# Patient Record
Sex: Female | Born: 1950
Health system: Southern US, Community
[De-identification: ages and names within clinical notes are randomized; demographics above are authoritative.]

## PROBLEM LIST (undated history)

## (undated) DIAGNOSIS — I2510 Atherosclerotic heart disease of native coronary artery without angina pectoris: Secondary | ICD-10-CM

## (undated) DIAGNOSIS — I10 Essential (primary) hypertension: Secondary | ICD-10-CM

## (undated) DIAGNOSIS — K219 Gastro-esophageal reflux disease without esophagitis: Secondary | ICD-10-CM

## (undated) DIAGNOSIS — E785 Hyperlipidemia, unspecified: Secondary | ICD-10-CM

## (undated) DIAGNOSIS — M1990 Unspecified osteoarthritis, unspecified site: Secondary | ICD-10-CM

## (undated) HISTORY — PX: TENDON REPAIR: SHX5111

## (undated) HISTORY — DX: Essential (primary) hypertension: I10

## (undated) HISTORY — DX: Hyperlipidemia, unspecified: E78.5

## (undated) HISTORY — DX: Gastro-esophageal reflux disease without esophagitis: K21.9

## (undated) HISTORY — PX: SHOULDER ARTHROSCOPY: SHX128

## (undated) HISTORY — PX: CHOLECYSTECTOMY: SHX55

## (undated) HISTORY — DX: Atherosclerotic heart disease of native coronary artery without angina pectoris: I25.10

---

## 2002-05-27 ENCOUNTER — Ambulatory Visit (HOSPITAL_COMMUNITY): Admission: EM | Admit: 2002-05-27 | Discharge: 2002-05-28 | Payer: Self-pay | Admitting: Cardiology

## 2010-05-14 ENCOUNTER — Ambulatory Visit (INDEPENDENT_AMBULATORY_CARE_PROVIDER_SITE_OTHER): Payer: Self-pay | Admitting: Internal Medicine

## 2010-05-14 DIAGNOSIS — K219 Gastro-esophageal reflux disease without esophagitis: Secondary | ICD-10-CM

## 2010-05-14 DIAGNOSIS — R1013 Epigastric pain: Secondary | ICD-10-CM

## 2011-01-08 HISTORY — PX: SHOULDER ARTHROSCOPY: SHX128

## 2013-04-06 ENCOUNTER — Encounter (INDEPENDENT_AMBULATORY_CARE_PROVIDER_SITE_OTHER): Payer: Self-pay | Admitting: *Deleted

## 2013-04-06 ENCOUNTER — Encounter (INDEPENDENT_AMBULATORY_CARE_PROVIDER_SITE_OTHER): Payer: Self-pay

## 2013-04-28 ENCOUNTER — Ambulatory Visit: Payer: Self-pay | Admitting: Podiatrist

## 2013-09-01 ENCOUNTER — Encounter (INDEPENDENT_AMBULATORY_CARE_PROVIDER_SITE_OTHER): Payer: Self-pay | Admitting: *Deleted

## 2013-09-29 ENCOUNTER — Telehealth (INDEPENDENT_AMBULATORY_CARE_PROVIDER_SITE_OTHER): Payer: Self-pay | Admitting: *Deleted

## 2013-09-29 ENCOUNTER — Ambulatory Visit (INDEPENDENT_AMBULATORY_CARE_PROVIDER_SITE_OTHER): Payer: BC Managed Care – PPO | Admitting: Internal Medicine

## 2013-09-29 ENCOUNTER — Other Ambulatory Visit (INDEPENDENT_AMBULATORY_CARE_PROVIDER_SITE_OTHER): Payer: Self-pay | Admitting: *Deleted

## 2013-09-29 ENCOUNTER — Encounter (INDEPENDENT_AMBULATORY_CARE_PROVIDER_SITE_OTHER): Payer: Self-pay | Admitting: Internal Medicine

## 2013-09-29 VITALS — BP 138/78 | HR 72 | Temp 98.0°F | Ht 63.0 in | Wt 179.6 lb

## 2013-09-29 DIAGNOSIS — Z1211 Encounter for screening for malignant neoplasm of colon: Secondary | ICD-10-CM

## 2013-09-29 DIAGNOSIS — R195 Other fecal abnormalities: Secondary | ICD-10-CM

## 2013-09-29 DIAGNOSIS — Z800 Family history of malignant neoplasm of digestive organs: Secondary | ICD-10-CM

## 2013-09-29 DIAGNOSIS — I10 Essential (primary) hypertension: Secondary | ICD-10-CM | POA: Insufficient documentation

## 2013-09-29 NOTE — Telephone Encounter (Signed)
Patient needs movi prep 

## 2013-09-29 NOTE — Progress Notes (Signed)
   Subjective:    Patient ID: Angel Benton, female    DOB: 21-Jan-1950, 63 y.o.   MRN: [id number]  HPI Referred to our office by Dr. Sherryll Burger for +stool cards.  Appetite is good. No weight loss.  No abdominal pain. No dysphagia.   She has had some rectal pain since April or May. She has not seen any blood in her stool or when she wiped. No change in stool. She has a BM daily. No change in caliber.   04/16/2013 H and H 13.5 and 39.4, MCV 92.   Last colonoscopy in 2010. Family hx significant for colon carcinoma, dada at age 48, died within months of metastatic disease. Normal exam except for external hemorrhoids. Review of Systems Past Medical History  Diagnosis Date  . GERD (gastroesophageal reflux disease)   . Hypertension     Past Surgical History  Procedure Laterality Date  . Cholecystectomy      Allergies  Allergen Reactions  . Penicillins     Swelling.    No current outpatient prescriptions on file prior to visit.   No current facility-administered medications on file prior to visit.   Current Outpatient Prescriptions  Medication Sig Dispense Refill  . amLODipine (NORVASC) 5 MG tablet Take 5 mg by mouth daily.      Marland Kitchen dexlansoprazole (DEXILANT) 60 MG capsule Take 60 mg by mouth daily.      Marland Kitchen escitalopram (LEXAPRO) 5 MG tablet Take 5 mg by mouth daily.      Marland Kitchen lisinopril (PRINIVIL,ZESTRIL) 40 MG tablet Take 40 mg by mouth daily.       No current facility-administered medications for this visit.        Objective:   Physical Exam  Filed Vitals:   09/29/13 1109  BP: 138/78  Pulse: 72  Temp: 98 F (36.7 C)  Height:  (1.6 m)  Weight: 179 lb 9.6 oz (81.466 kg)  Alert and oriented. Skin warm and dry. Oral mucosa is moist.   . Sclera anicteric, conjunctivae is pink. Thyroid not enlarged. No cervical lymphadenopathy. Lungs clear. Heart regular rate and rhythm.  Abdomen is soft. Bowel sounds are positive. No hepatomegaly. No abdominal masses felt. No tenderness.  No  edema to lower extremities.           Assessment & Plan:  +stool cards. Family hx of colon cancer (father)   High risk screening colonoscopy

## 2013-09-29 NOTE — Patient Instructions (Signed)
Colonoscopy with DR. Rehman.  

## 2013-10-04 MED ORDER — PEG-KCL-NACL-NASULF-NA ASC-C 100 G PO SOLR: 1.0000 | Freq: Once | ORAL | Status: DC

## 2013-10-11 ENCOUNTER — Encounter (HOSPITAL_COMMUNITY): Payer: Self-pay | Admitting: Pharmacy Technician

## 2013-10-29 ENCOUNTER — Ambulatory Visit (HOSPITAL_COMMUNITY)
Admission: RE | Admit: 2013-10-29 | Discharge: 2013-10-29 | Disposition: A | Discharge status: Home / Self Care | Payer: BC Managed Care – PPO | Source: Ambulatory Visit | Attending: Internal Medicine | Admitting: Internal Medicine

## 2013-10-29 ENCOUNTER — Encounter (HOSPITAL_COMMUNITY): Payer: Self-pay

## 2013-10-29 ENCOUNTER — Encounter (HOSPITAL_COMMUNITY)
Admission: RE | Disposition: A | Discharge status: Home / Self Care | Payer: Self-pay | Source: Ambulatory Visit | Attending: Internal Medicine

## 2013-10-29 DIAGNOSIS — I10 Essential (primary) hypertension: Secondary | ICD-10-CM | POA: Insufficient documentation

## 2013-10-29 DIAGNOSIS — K219 Gastro-esophageal reflux disease without esophagitis: Secondary | ICD-10-CM | POA: Diagnosis not present

## 2013-10-29 DIAGNOSIS — Z800 Family history of malignant neoplasm of digestive organs: Secondary | ICD-10-CM | POA: Insufficient documentation

## 2013-10-29 DIAGNOSIS — K644 Residual hemorrhoidal skin tags: Secondary | ICD-10-CM | POA: Diagnosis not present

## 2013-10-29 DIAGNOSIS — Z79899 Other long term (current) drug therapy: Secondary | ICD-10-CM | POA: Insufficient documentation

## 2013-10-29 DIAGNOSIS — R195 Other fecal abnormalities: Secondary | ICD-10-CM

## 2013-10-29 DIAGNOSIS — K921 Melena: Secondary | ICD-10-CM

## 2013-10-29 DIAGNOSIS — K648 Other hemorrhoids: Secondary | ICD-10-CM

## 2013-10-29 HISTORY — PX: COLONOSCOPY: SHX5424

## 2013-10-29 SURGERY — COLONOSCOPY
Anesthesia: Moderate Sedation

## 2013-10-29 MED ORDER — SODIUM CHLORIDE 0.9 % IV SOLN
INTRAVENOUS | Status: DC
  Administered 2013-10-29: 07:00:00 via INTRAVENOUS

## 2013-10-29 MED ORDER — MIDAZOLAM HCL 5 MG/5ML IJ SOLN
INTRAMUSCULAR | Status: AC
  Filled 2013-10-29: qty 10

## 2013-10-29 MED ORDER — MEPERIDINE HCL 50 MG/ML IJ SOLN
INTRAMUSCULAR | Status: DC | PRN
  Administered 2013-10-29 (×2): 25 mg via INTRAVENOUS

## 2013-10-29 MED ORDER — STERILE WATER FOR IRRIGATION IR SOLN
Status: DC | PRN
  Administered 2013-10-29: 07:00:00

## 2013-10-29 MED ORDER — MIDAZOLAM HCL 5 MG/5ML IJ SOLN
INTRAMUSCULAR | Status: DC | PRN
  Administered 2013-10-29: 2 mg via INTRAVENOUS
  Administered 2013-10-29: 1 mg via INTRAVENOUS
  Administered 2013-10-29: 2 mg via INTRAVENOUS
  Administered 2013-10-29: 1 mg via INTRAVENOUS

## 2013-10-29 MED ORDER — MEPERIDINE HCL 50 MG/ML IJ SOLN
INTRAMUSCULAR | Status: AC
  Filled 2013-10-29: qty 1

## 2013-10-29 NOTE — H&P (Signed)
Angel Benton is an 63 y.o. female.   Chief Complaint: Patient is here for colonoscopy. HPI: Patient is 63 year old Caucasian female who was recently noted to have heme positive stool on routine exam. She is therefore here for diagnostic colonoscopy. She denies abdominal pain change in bowel habits or rectal bleeding. She also denies melena. He she's had pain in her tailbone. Last colonoscopy was in April 2010. Family history significant for CRC in father who was 35 at the time of diagnosis and died a year later of metastatic disease. She has one first cousin who was diagnosed with CRC in her 58s and is doing fine.  Past Medical History  Diagnosis Date  . GERD (gastroesophageal reflux disease)   . Hypertension     Past Surgical History  Procedure Laterality Date  . Cholecystectomy    . Shoulder arthroscopy Left     2014 [place]  . Shoulder arthroscopy Right 2013    cone surgery    History reviewed. No pertinent family history. Social History:  reports that she has never smoked. She does not have any smokeless tobacco history on file. She reports that she does not drink alcohol or use illicit drugs.  Allergies:  Allergies  Allergen Reactions  . Penicillins     Swelling.    Medications Prior to Admission  Medication Sig Dispense Refill  . ALPRAZolam (XANAX) 0.25 MG tablet Take 0.25 mg by mouth daily as needed for anxiety.      Marland Kitchen amLODipine (NORVASC) 5 MG tablet Take 5 mg by mouth at bedtime.       Marland Kitchen dexlansoprazole (DEXILANT) 60 MG capsule Take 60 mg by mouth daily.      Marland Kitchen escitalopram (LEXAPRO) 5 MG tablet Take 5 mg by mouth daily.      Marland Kitchen lisinopril (PRINIVIL,ZESTRIL) 40 MG tablet Take 40 mg by mouth daily.      . peg 3350 powder (MOVIPREP) 100 G SOLR Take 1 kit (200 g total) by mouth once.  1 kit  0    No results found for this or any previous visit (from the past 48 hour(s)). No results found.  ROS  Blood pressure 165/69, pulse 64, temperature 98.3 F  (36.8 C), temperature source Oral, resp. rate 18, height 5' 2.5" (1.588 m), weight 178 lb (80.74 kg), SpO2 100.00%. Physical Exam  Constitutional: She appears well-developed and well-nourished.  HENT:  Mouth/Throat: Oropharynx is clear and moist.  Eyes: Conjunctivae are normal. No scleral icterus.  Neck: No thyromegaly present.  Cardiovascular: Normal rate, regular rhythm and normal heart sounds.   No murmur heard. Respiratory: Effort normal and breath sounds normal.  GI: Soft. She exhibits no distension and no mass. There is no tenderness.  Musculoskeletal: She exhibits no edema.  Lymphadenopathy:    She has no cervical adenopathy.  Neurological: She is alert.  Skin: Skin is warm and dry.     Assessment/Plan Heme-positive stool. Family history of CRC in the first and second degree relative. Diagnostic colonoscopy.  Angel,NAJEEB Benton 10/29/2013, 7:36 AM

## 2013-10-29 NOTE — Discharge Instructions (Signed)
Resume usual medications and diet; Can try OTC Nexium instead of Dexilant for 4 weeks and then go back on Dexilant. No driving for 24 hours. Next colonoscopy in 5 years.    Colonoscopy, Care After Refer to this sheet in the next few weeks. These instructions provide you with information on caring for yourself after your procedure. Your health care provider may also give you more specific instructions. Your treatment has been planned according to current medical practices, but problems sometimes occur. Call your health care provider if you have any problems or questions after your procedure. WHAT TO EXPECT AFTER THE PROCEDURE  After your procedure, it is typical to have the following:  A small amount of blood in your stool.  Moderate amounts of gas and mild abdominal cramping or bloating. HOME CARE INSTRUCTIONS  Do not drive, operate machinery, or sign important documents for 24 hours.  You may shower and resume your regular physical activities, but move at a slower pace for the first 24 hours.  Take frequent rest periods for the first 24 hours.  Walk around or put a warm pack on your abdomen to help reduce abdominal cramping and bloating.  Drink enough fluids to keep your urine clear or pale yellow.  You may resume your normal diet as instructed by your health care provider. Avoid heavy or fried foods that are hard to digest.  Avoid drinking alcohol for 24 hours or as instructed by your health care provider.  Only take over-the-counter or prescription medicines as directed by your health care provider.  If a tissue sample (biopsy) was taken during your procedure:  Do not take aspirin or blood thinners for 7 days, or as instructed by your health care provider.  Do not drink alcohol for 7 days, or as instructed by your health care provider.  Eat soft foods for the first 24 hours. SEEK MEDICAL CARE IF: You have persistent spotting of blood in your stool 2-3 days after the  procedure. SEEK IMMEDIATE MEDICAL CARE IF:  You have more than a small spotting of blood in your stool.  You pass large blood clots in your stool.  Your abdomen is swollen (distended).  You have nausea or vomiting.  You have a fever.  You have increasing abdominal pain that is not relieved with medicine. Document Released: 08/08/2003 Document Revised: 10/14/2012 Document Reviewed: 08/31/2012 [facility] Patient Information 2015 [place], [place]. This information is not intended to replace advice given to you by your health care provider. Make sure you discuss any questions you have with your health care provider.

## 2013-10-29 NOTE — Op Note (Signed)
COLONOSCOPY PROCEDURE REPORT  PATIENT:  Angel Benton  MR#:  [id number] Birthdate:  January 07, 1951, 63 y.o., female Endoscopist:  Dr. Malissa HippoNajeeb U. Rehman, MD Referred By:  Dr. Kirstie PeriAshish Shah, MD Procedure Date: 10/29/2013  Procedure:   Colonoscopy  Indications:  Patient is 63 year old Caucasian female who is undergoing diagnostic colonoscopy because of heme-positive stool. She has no GI symptoms. Family history however is significant for colon carcinoma father who was diagnosed with advanced disease age 62 and died at 2564 and she has a first cousin(father's side) who had surgery for CRC in her early fifteenths and is doing fine.  Informed Consent:  The procedure and risks were reviewed with the patient and informed consent was obtained.  Medications:  Demerol 50 mg IV Versed 6 mg IV  Description of procedure:  After a digital rectal exam was performed, that colonoscope was advanced from the anus through the rectum and colon to the area of the cecum, ileocecal valve and appendiceal orifice. The cecum was deeply intubated. These structures were well-seen and photographed for the record. From the level of the cecum and ileocecal valve, the scope was slowly and cautiously withdrawn. The mucosal surfaces were carefully surveyed utilizing scope tip to flexion to facilitate fold flattening as needed. The scope was pulled down into the rectum where a thorough exam including retroflexion was performed.  Findings:   Prep excellent. Normal mucosa of cecum, ascending colon, hepatic flexure, transverse colon, splenic flexure, descending and sigmoid colon. Normal mucosa of rectum. Small hemorrhoids below the dentate line.   Therapeutic/Diagnostic Maneuvers Performed:   None  Complications:  None  Cecal Withdrawal Time:  12 minutes  Impression:  Normal colonoscopy except small external hemorrhoids.  Recommendations:  Standard instructions given. Next colonoscopy in 5 years cause of positive family  history.  REHMAN,NAJEEB U  10/29/2013 8:08 AM  CC: Dr. Kirstie PeriSHAH,ASHISH, MD & Dr. Bonnetta BarryNo ref. provider found

## 2013-11-02 ENCOUNTER — Encounter (HOSPITAL_COMMUNITY): Payer: Self-pay | Admitting: Internal Medicine

## 2013-12-22 ENCOUNTER — Encounter (INDEPENDENT_AMBULATORY_CARE_PROVIDER_SITE_OTHER): Payer: Self-pay

## 2016-01-27 DIAGNOSIS — I10 Essential (primary) hypertension: Secondary | ICD-10-CM | POA: Diagnosis not present

## 2016-01-27 DIAGNOSIS — K5641 Fecal impaction: Secondary | ICD-10-CM | POA: Diagnosis not present

## 2016-01-27 DIAGNOSIS — Z79899 Other long term (current) drug therapy: Secondary | ICD-10-CM | POA: Diagnosis not present

## 2016-01-27 DIAGNOSIS — Z87891 Personal history of nicotine dependence: Secondary | ICD-10-CM | POA: Diagnosis not present

## 2016-02-08 DIAGNOSIS — Z1231 Encounter for screening mammogram for malignant neoplasm of breast: Secondary | ICD-10-CM | POA: Diagnosis not present

## 2016-03-06 DIAGNOSIS — I10 Essential (primary) hypertension: Secondary | ICD-10-CM | POA: Diagnosis not present

## 2016-03-06 DIAGNOSIS — F419 Anxiety disorder, unspecified: Secondary | ICD-10-CM | POA: Diagnosis not present

## 2016-03-06 DIAGNOSIS — Z87891 Personal history of nicotine dependence: Secondary | ICD-10-CM | POA: Diagnosis not present

## 2016-03-06 DIAGNOSIS — Z299 Encounter for prophylactic measures, unspecified: Secondary | ICD-10-CM | POA: Diagnosis not present

## 2016-03-06 DIAGNOSIS — Z6831 Body mass index (BMI) 31.0-31.9, adult: Secondary | ICD-10-CM | POA: Diagnosis not present

## 2016-03-06 DIAGNOSIS — J069 Acute upper respiratory infection, unspecified: Secondary | ICD-10-CM | POA: Diagnosis not present

## 2016-03-06 DIAGNOSIS — Z713 Dietary counseling and surveillance: Secondary | ICD-10-CM | POA: Diagnosis not present

## 2016-05-01 DIAGNOSIS — I8311 Varicose veins of right lower extremity with inflammation: Secondary | ICD-10-CM | POA: Diagnosis not present

## 2016-05-01 DIAGNOSIS — M79605 Pain in left leg: Secondary | ICD-10-CM | POA: Diagnosis not present

## 2016-05-01 DIAGNOSIS — M79604 Pain in right leg: Secondary | ICD-10-CM | POA: Diagnosis not present

## 2016-05-01 DIAGNOSIS — I8312 Varicose veins of left lower extremity with inflammation: Secondary | ICD-10-CM | POA: Diagnosis not present

## 2016-05-08 DIAGNOSIS — K210 Gastro-esophageal reflux disease with esophagitis: Secondary | ICD-10-CM | POA: Diagnosis not present

## 2016-05-08 DIAGNOSIS — E785 Hyperlipidemia, unspecified: Secondary | ICD-10-CM | POA: Diagnosis not present

## 2016-05-08 DIAGNOSIS — Z299 Encounter for prophylactic measures, unspecified: Secondary | ICD-10-CM | POA: Diagnosis not present

## 2016-05-08 DIAGNOSIS — Z1389 Encounter for screening for other disorder: Secondary | ICD-10-CM | POA: Diagnosis not present

## 2016-05-08 DIAGNOSIS — R319 Hematuria, unspecified: Secondary | ICD-10-CM | POA: Diagnosis not present

## 2016-05-08 DIAGNOSIS — I259 Chronic ischemic heart disease, unspecified: Secondary | ICD-10-CM | POA: Diagnosis not present

## 2016-05-08 DIAGNOSIS — Z79899 Other long term (current) drug therapy: Secondary | ICD-10-CM | POA: Diagnosis not present

## 2016-05-08 DIAGNOSIS — Z6831 Body mass index (BMI) 31.0-31.9, adult: Secondary | ICD-10-CM | POA: Diagnosis not present

## 2016-05-08 DIAGNOSIS — Z7189 Other specified counseling: Secondary | ICD-10-CM | POA: Diagnosis not present

## 2016-05-08 DIAGNOSIS — Z0000 Encounter for general adult medical examination without abnormal findings: Secondary | ICD-10-CM | POA: Diagnosis not present

## 2016-05-08 DIAGNOSIS — F411 Generalized anxiety disorder: Secondary | ICD-10-CM | POA: Diagnosis not present

## 2016-05-08 DIAGNOSIS — Z1211 Encounter for screening for malignant neoplasm of colon: Secondary | ICD-10-CM | POA: Diagnosis not present

## 2016-05-10 DIAGNOSIS — R1031 Right lower quadrant pain: Secondary | ICD-10-CM | POA: Diagnosis not present

## 2016-05-10 DIAGNOSIS — E785 Hyperlipidemia, unspecified: Secondary | ICD-10-CM | POA: Diagnosis not present

## 2016-05-10 DIAGNOSIS — R319 Hematuria, unspecified: Secondary | ICD-10-CM | POA: Diagnosis not present

## 2016-05-10 DIAGNOSIS — I10 Essential (primary) hypertension: Secondary | ICD-10-CM | POA: Diagnosis not present

## 2016-05-10 DIAGNOSIS — K449 Diaphragmatic hernia without obstruction or gangrene: Secondary | ICD-10-CM | POA: Diagnosis not present

## 2016-05-10 DIAGNOSIS — Z6831 Body mass index (BMI) 31.0-31.9, adult: Secondary | ICD-10-CM | POA: Diagnosis not present

## 2016-05-10 DIAGNOSIS — R109 Unspecified abdominal pain: Secondary | ICD-10-CM | POA: Diagnosis not present

## 2016-05-10 DIAGNOSIS — Z299 Encounter for prophylactic measures, unspecified: Secondary | ICD-10-CM | POA: Diagnosis not present

## 2016-05-10 DIAGNOSIS — M549 Dorsalgia, unspecified: Secondary | ICD-10-CM | POA: Diagnosis not present

## 2016-05-11 ENCOUNTER — Encounter (HOSPITAL_COMMUNITY): Payer: Self-pay

## 2016-05-11 ENCOUNTER — Emergency Department (HOSPITAL_COMMUNITY)
Admission: EM | Admit: 2016-05-11 | Discharge: 2016-05-11 | Disposition: A | Discharge status: Home / Self Care | Payer: Medicare Other | Attending: Emergency Medicine | Admitting: Emergency Medicine

## 2016-05-11 ENCOUNTER — Emergency Department (HOSPITAL_COMMUNITY): Payer: Medicare Other

## 2016-05-11 DIAGNOSIS — I10 Essential (primary) hypertension: Secondary | ICD-10-CM | POA: Insufficient documentation

## 2016-05-11 DIAGNOSIS — N201 Calculus of ureter: Secondary | ICD-10-CM | POA: Insufficient documentation

## 2016-05-11 DIAGNOSIS — N200 Calculus of kidney: Secondary | ICD-10-CM | POA: Diagnosis not present

## 2016-05-11 DIAGNOSIS — Z7982 Long term (current) use of aspirin: Secondary | ICD-10-CM | POA: Insufficient documentation

## 2016-05-11 DIAGNOSIS — R109 Unspecified abdominal pain: Secondary | ICD-10-CM | POA: Diagnosis present

## 2016-05-11 LAB — URINALYSIS, ROUTINE W REFLEX MICROSCOPIC
Bilirubin Urine: NEGATIVE
Glucose, UA: NEGATIVE mg/dL
Ketones, ur: 5 mg/dL — AB
Leukocytes, UA: NEGATIVE
Nitrite: NEGATIVE
PH: 5 (ref 5.0–8.0)
PROTEIN: 100 mg/dL — AB
Specific Gravity, Urine: 1.024 (ref 1.005–1.030)

## 2016-05-11 LAB — COMPREHENSIVE METABOLIC PANEL
ALBUMIN: 3.9 g/dL (ref 3.5–5.0)
ALK PHOS: 83 U/L (ref 38–126)
ALT: 16 U/L (ref 14–54)
AST: 21 U/L (ref 15–41)
Anion gap: 8 (ref 5–15)
BUN: 18 mg/dL (ref 6–20)
CO2: 29 mmol/L (ref 22–32)
Calcium: 9.3 mg/dL (ref 8.9–10.3)
Chloride: 105 mmol/L (ref 101–111)
Creatinine, Ser: 0.96 mg/dL (ref 0.44–1.00)
GFR calc Af Amer: >60 mL/min (ref >60)
GLUCOSE: 128 mg/dL — AB (ref 65–99)
Potassium: 3.3 mmol/L — ABNORMAL LOW (ref 3.5–5.1)
Sodium: 142 mmol/L (ref 135–145)
Total Bilirubin: 0.5 mg/dL (ref 0.3–1.2)
Total Protein: 6.7 g/dL (ref 6.5–8.1)

## 2016-05-11 LAB — CBC WITH DIFFERENTIAL/PLATELET
BASOS PCT: 1 %
Basophils Absolute: 0.1 10*3/uL (ref 0.0–0.1)
Eosinophils Absolute: 0.3 10*3/uL (ref 0.0–0.7)
Eosinophils Relative: 3 %
HEMATOCRIT: 41.9 % (ref 36.0–46.0)
Hemoglobin: 13.5 g/dL (ref 12.0–15.0)
LYMPHS PCT: 38 %
Lymphs Abs: 3 10*3/uL (ref 0.7–4.0)
MCH: 30.3 pg (ref 26.0–34.0)
MCHC: 32.2 g/dL (ref 30.0–36.0)
MCV: 93.9 fL (ref 78.0–100.0)
MONO ABS: 0.5 10*3/uL (ref 0.1–1.0)
Monocytes Relative: 6 %
NEUTROS ABS: 4.1 10*3/uL (ref 1.7–7.7)
NEUTROS PCT: 52 %
Platelets: 310 10*3/uL (ref 150–400)
RBC: 4.46 MIL/uL (ref 3.87–5.11)
RDW: 12.9 % (ref 11.5–15.5)
WBC: 7.9 10*3/uL (ref 4.0–10.5)

## 2016-05-11 LAB — LIPASE, BLOOD: Lipase: 24 U/L (ref 11–51)

## 2016-05-11 MED ORDER — IBUPROFEN 800 MG PO TABS: 800.0000 mg | ORAL_TABLET | Freq: Three times a day (TID) | ORAL | 0 refills | Status: DC

## 2016-05-11 MED ORDER — IOPAMIDOL (ISOVUE-300) INJECTION 61%
INTRAVENOUS | Status: AC
  Administered 2016-05-11: 100 mL
  Filled 2016-05-11: qty 100

## 2016-05-11 MED ORDER — SODIUM CHLORIDE 0.9 % IV BOLUS (SEPSIS)
500.0000 mL | Freq: Once | INTRAVENOUS | Status: AC
  Administered 2016-05-11: 500 mL via INTRAVENOUS

## 2016-05-11 MED ORDER — HYDROMORPHONE HCL 1 MG/ML IJ SOLN
1.0000 mg | Freq: Once | INTRAMUSCULAR | Status: AC
  Administered 2016-05-11: 1 mg via INTRAVENOUS
  Filled 2016-05-11: qty 1

## 2016-05-11 MED ORDER — TAMSULOSIN HCL 0.4 MG PO CAPS
0.4000 mg | ORAL_CAPSULE | Freq: Once | ORAL | Status: AC
Start: 2016-05-11 — End: 2016-05-11
  Administered 2016-05-11: 0.4 mg via ORAL
  Filled 2016-05-11: qty 1

## 2016-05-11 MED ORDER — OXYCODONE-ACETAMINOPHEN 5-325 MG PO TABS: 2.0000 | ORAL_TABLET | ORAL | 0 refills | Status: DC | PRN

## 2016-05-11 MED ORDER — TAMSULOSIN HCL 0.4 MG PO CAPS: 0.4000 mg | ORAL_CAPSULE | Freq: Every day | ORAL | 0 refills | Status: DC

## 2016-05-11 MED ORDER — KETOROLAC TROMETHAMINE 30 MG/ML IJ SOLN
30.0000 mg | Freq: Once | INTRAMUSCULAR | Status: AC
  Administered 2016-05-11: 30 mg via INTRAVENOUS
  Filled 2016-05-11: qty 1

## 2016-05-11 MED ORDER — ONDANSETRON HCL 4 MG/2ML IJ SOLN
4.0000 mg | Freq: Once | INTRAMUSCULAR | Status: AC
  Administered 2016-05-11: 4 mg via INTRAVENOUS
  Filled 2016-05-11: qty 2

## 2016-05-11 MED ORDER — ONDANSETRON 4 MG PO TBDP
ORAL_TABLET | ORAL | Status: AC
  Filled 2016-05-11: qty 1

## 2016-05-11 MED ORDER — OXYCODONE-ACETAMINOPHEN 5-325 MG PO TABS
2.0000 | ORAL_TABLET | Freq: Once | ORAL | Status: AC
  Administered 2016-05-11: 2 via ORAL
  Filled 2016-05-11: qty 2

## 2016-05-11 MED ORDER — ONDANSETRON 4 MG PO TBDP: 4.0000 mg | ORAL_TABLET | ORAL | 0 refills | Status: DC | PRN

## 2016-05-11 MED ORDER — ONDANSETRON 4 MG PO TBDP
4.0000 mg | ORAL_TABLET | Freq: Once | ORAL | Status: AC
  Administered 2016-05-11: 4 mg via ORAL

## 2016-05-11 NOTE — ED Triage Notes (Signed)
Pt presents with 5 day h/o low back pain that radiates to L flank and to L groin.  Pt reports hematuria as well, saw PCP on Wednesday, was treated with cipro with no relief, had CT yesterday that was negative.  Pt reports pain has worsened, +nausea and vomiting

## 2016-05-11 NOTE — ED Provider Notes (Signed)
[facility] Provider Note   CSN: [id number] Arrival date & time: 05/11/16  1051     History   Chief Complaint No chief complaint on file.   HPI Angel Benton is a 66 y.o. female.  HPI Patient has left flank pain has gotten progressively worse. She was seen by her PCP earlier in the week and initially treated for a kidney infection based on hematuria. Pain then progressed and she had a CT scan done that did not show acute anomaly.Since that time her pain has increased significantly. She continues to have blood in the urine. She has not developed fever but now has significant nausea and vomiting. Past Medical History:  Diagnosis Date  . GERD (gastroesophageal reflux disease)   . Hypertension     Patient Active Problem List   Diagnosis Date Noted  . Guaiac positive stools 09/29/2013  . Family history of colon cancer 09/29/2013  . Essential hypertension, benign 09/29/2013    Past Surgical History:  Procedure Laterality Date  . CHOLECYSTECTOMY    . COLONOSCOPY N/A 10/29/2013   Procedure: COLONOSCOPY;  Surgeon: Malissa Hippo, MD;  Location: [facility];  Service: Endoscopy;  Laterality: N/A;  730  . SHOULDER ARTHROSCOPY Left    2014 [place]  . SHOULDER ARTHROSCOPY Right 2013   cone surgery    OB History    No data available       Home Medications    Prior to Admission medications   Medication Sig Start Date End Date Taking? Authorizing Provider  ALPRAZolam Prudy Feeler) 0.25 MG tablet Take 0.25 mg by mouth daily as needed for anxiety.   Yes [provider]  amLODipine (NORVASC) 5 MG tablet Take 5 mg by mouth at bedtime.    Yes [provider]  aspirin EC 81 MG tablet Take 81 mg by mouth daily.   Yes [provider]  ciprofloxacin (CIPRO) 500 MG tablet Take 500 mg by mouth 2 (two) times daily. Started on 05-08-16 for 10 days. 05/08/16  Yes [provider]  dexlansoprazole (DEXILANT) 60 MG capsule Take 60 mg by  mouth daily.   Yes [provider]  escitalopram (LEXAPRO) 5 MG tablet Take 5 mg by mouth daily.   Yes [provider]  lisinopril (PRINIVIL,ZESTRIL) 40 MG tablet Take 40 mg by mouth daily.   Yes [provider]  ibuprofen (ADVIL,MOTRIN) 800 MG tablet Take 1 tablet (800 mg total) by mouth 3 (three) times daily. 05/11/16   Arby Barrette, MD  ondansetron (ZOFRAN ODT) 4 MG disintegrating tablet Take 1 tablet (4 mg total) by mouth every 4 (four) hours as needed for nausea or vomiting. 05/11/16   Arby Barrette, MD  oxyCODONE-acetaminophen (PERCOCET) 5-325 MG tablet Take 2 tablets by mouth every 4 (four) hours as needed. 05/11/16   Arby Barrette, MD  tamsulosin (FLOMAX) 0.4 MG CAPS capsule Take 1 capsule (0.4 mg total) by mouth daily. 05/11/16   Arby Barrette, MD    Family History No family history on file.  Social History Social History  Substance Use Topics  . Smoking status: Never Smoker  . Smokeless tobacco: Not on file  . Alcohol use No     Allergies   Penicillins   Review of Systems Review of Systems 10 Systems reviewed and are negative for acute change except as noted in the HPI.   Physical Exam Updated Vital Signs BP 131/81 (BP Location: Right Arm)   Pulse 81   Temp 98.4 F (36.9  C) (Oral)   Resp 16   SpO2 97%   Physical Exam  Constitutional: She is oriented to person, place, and time. She appears well-developed and well-nourished. She appears distressed.  Patient appears in significant pain. She is alert and appropriate. No respiratory distress.  HENT:  Head: Normocephalic and atraumatic.  Mouth/Throat: Oropharynx is clear and moist.  Eyes: Conjunctivae and EOM are normal.  Neck: Neck supple.  Cardiovascular: Normal rate, regular rhythm and intact distal pulses.   No murmur heard. Pulmonary/Chest: Effort normal and breath sounds normal. No respiratory distress.  Abdominal: Soft. Bowel sounds are normal. There is tenderness.  Moderate  far left lateral discomfort to palpation. No guarding no rebound.  Musculoskeletal: She exhibits no edema or tenderness.  Neurological: She is alert and oriented to person, place, and time. No cranial nerve deficit. She exhibits normal muscle tone. Coordination normal.  Skin: Skin is warm and dry.  Psychiatric: She has a normal mood and affect.  Nursing note and vitals reviewed.    ED Treatments / Results  Labs (all labs ordered are listed, but only abnormal results are displayed) Labs Reviewed  COMPREHENSIVE METABOLIC PANEL - Abnormal; Notable for the following:       Result Value   Potassium 3.3 (*)    Glucose, Bld 128 (*)    All other components within normal limits  URINALYSIS, ROUTINE W REFLEX MICROSCOPIC - Abnormal; Notable for the following:    Color, Urine RED (*)    APPearance CLOUDY (*)    Hgb urine dipstick LARGE (*)    Ketones, ur 5 (*)    Protein, ur 100 (*)    Bacteria, UA FEW (*)    Squamous Epithelial / LPF 0-5 (*)    Non Squamous Epithelial 0-5 (*)    All other components within normal limits  URINE CULTURE  CBC WITH DIFFERENTIAL/PLATELET  LIPASE, BLOOD    EKG  EKG Interpretation None       Radiology Ct Abdomen Pelvis W Contrast  Result Date: 05/11/2016 CLINICAL DATA:  Left-sided pain with hematuria. EXAM: CT ABDOMEN AND PELVIS WITH CONTRAST TECHNIQUE: Multidetector CT imaging of the abdomen and pelvis was performed using the standard protocol following bolus administration of intravenous contrast. CONTRAST:  ISOVUE-300 IOPAMIDOL (ISOVUE-300) INJECTION 61% COMPARISON:  Stone study of 1 day prior FINDINGS: Lower chest: Clear lung bases. Mild cardiomegaly, without pericardial effusion. Right coronary artery atherosclerosis. Tiny hiatal hernia. Hepatobiliary: Minimal motion degradation in the mid abdomen. Cholecystectomy without biliary duct dilatation. Pancreas: Normal, without mass or ductal dilatation. Spleen: Normal in size, without focal abnormality.  Adrenals/Urinary Tract: Normal adrenal glands. 9 mm interpolar right renal cyst. Bilateral renal sinus cysts. Mild left perinephric edema with hydroureter. This is followed to the level of a 4 mm distal left ureteric stone on image 70/series 3. Normal urinary bladder. Stomach/Bowel: Gastric underdistention, without gross abnormality. Normal colon, appendix, and terminal ileum. Normal small bowel. Vascular/Lymphatic: Separate origins of the common hepatic and splenic arteries. No abdominopelvic adenopathy. Reproductive: Normal uterus and adnexa. Other: No significant free fluid. Musculoskeletal: No acute osseous abnormality. IMPRESSION: 1. Distal left ureteric calculus with mild proximal obstruction. 2. Mild motion degradation. 3. Age advanced coronary artery atherosclerosis. Recommend assessment of coronary risk factors and consideration of medical therapy. 4.  Tiny hiatal hernia. Electronically Signed   By: Jeronimo Greaves M.D.   On: 05/11/2016 15:08    Procedures Procedures (including critical care time)  Medications Ordered in ED Medications  ondansetron (ZOFRAN-ODT) disintegrating tablet 4  mg (4 mg Oral Given 05/11/16 1130)  sodium chloride 0.9 % bolus 500 mL (0 mLs Intravenous Stopped 05/11/16 1625)  ondansetron (ZOFRAN) injection 4 mg (4 mg Intravenous Given 05/11/16 1242)  HYDROmorphone (DILAUDID) injection 1 mg (1 mg Intravenous Given 05/11/16 1242)  iopamidol (ISOVUE-300) 61 % injection (100 mLs  Contrast Given 05/11/16 1427)  oxyCODONE-acetaminophen (PERCOCET/ROXICET) 5-325 MG per tablet 2 tablet (2 tablets Oral Given 05/11/16 1615)  tamsulosin (FLOMAX) capsule 0.4 mg (0.4 mg Oral Given 05/11/16 1615)  ketorolac (TORADOL) 30 MG/ML injection 30 mg (30 mg Intravenous Given 05/11/16 1615)     Initial Impression / Assessment and Plan / ED Course  I have reviewed the triage vital signs and the nursing notes.  Pertinent labs & imaging results that were available during my care of the patient were reviewed by  me and considered in my medical decision making (see chart for details).      Final Clinical Impressions(s) / ED Diagnoses   Final diagnoses:  Kidney stone  Repeat CT scan is obtained for other pathology with contrast and a rule out infarct or possible abscess. Scan returned positive for a 4 mm distal left-sided kidney stone. This is consistent with the patient's pain and history. She was pain controlled with IV medication in the emergency department. She was transitioedn to oral medications which she tolerated. Plan will be for strain the urine, outpatient pain control and follow-up with urology.  New Prescriptions Discharge Medication List as of 05/11/2016  4:34 PM    START taking these medications   Details  ibuprofen (ADVIL,MOTRIN) 800 MG tablet Take 1 tablet (800 mg total) by mouth 3 (three) times daily., Starting Sat 05/11/2016, Print    ondansetron (ZOFRAN ODT) 4 MG disintegrating tablet Take 1 tablet (4 mg total) by mouth every 4 (four) hours as needed for nausea or vomiting., Starting Sat 05/11/2016, Print    oxyCODONE-acetaminophen (PERCOCET) 5-325 MG tablet Take 2 tablets by mouth every 4 (four) hours as needed., Starting Sat 05/11/2016, Print    tamsulosin (FLOMAX) 0.4 MG CAPS capsule Take 1 capsule (0.4 mg total) by mouth daily., Starting Sat 05/11/2016, Print         Arby BarrettePfeiffer, Darivs Lunden, MD 05/12/16 1009

## 2016-05-14 LAB — URINE CULTURE: Culture: 50000 — AB

## 2016-05-15 ENCOUNTER — Telehealth: Payer: Self-pay | Admitting: Emergency Medicine

## 2016-05-15 NOTE — Progress Notes (Signed)
ED Antimicrobial Stewardship Positive Culture Follow Up   Teodora Mediciancy H Wank is an [age] y.o. female [facility] with a chief complaint of No chief complaint on file.   Recent Results (from the past 720 hour(s))  Urine culture     Status: Abnormal   Collection Time: 05/11/16 12:25 PM  Result Value Ref Range Status   Specimen Description URINE, RANDOM  Final   Special Requests ADDED 2232  Final   Culture (A)  Final    50,000 COLONIES/mL ESCHERICHIA COLI Confirmed Extended Spectrum Beta-Lactamase Producer (ESBL)    Report Status 05/14/2016 FINAL  Final   Organism ID, Bacteria ESCHERICHIA COLI (A)  Final      Susceptibility   Escherichia coli - MIC*    AMPICILLIN >=32 RESISTANT Resistant     CEFAZOLIN >=64 RESISTANT Resistant     CEFTRIAXONE >=64 RESISTANT Resistant     CIPROFLOXACIN >=4 RESISTANT Resistant     GENTAMICIN <=1 SENSITIVE Sensitive     IMIPENEM <=0.25 SENSITIVE Sensitive     NITROFURANTOIN <=16 SENSITIVE Sensitive     TRIMETH/SULFA >=320 RESISTANT Resistant     AMPICILLIN/SULBACTAM 8 SENSITIVE Sensitive     PIP/TAZO <=4 SENSITIVE Sensitive     Extended ESBL POSITIVE Resistant     * 50,000 COLONIES/mL ESCHERICHIA COLI   No abx indicated   ED Provider: [place] Russo, PA-C  Bertram MillardMichael A Mitsy Owen 05/15/2016, 8:32 AM Infectious Diseases Pharmacist Phone# [phone]

## 2016-05-15 NOTE — Telephone Encounter (Addendum)
Post ED Visit - Positive Culture Follow-up  Culture report reviewed by antimicrobial stewardship pharmacist:  [x]  Enzo BiNathan Batchelder, Pharm.D. []  Celedonio MiyamotoJeremy Frens, Pharm.D., BCPS AQ-ID []  Garvin FilaMike Maccia, Pharm.D., BCPS []  Georgina PillionElizabeth Martin, Pharm.D., BCPS []  [place]ousatonicMinh Pham, [address] Rainbow BoulevardPharm.D., BCPS, AAHIVP []  Estella HuskMichelle Turner, Pharm.D., BCPS, AAHIVP []  Lysle Pearlachel Rumbarger, PharmD, BCPS []  Casilda Carlsaylor Stone, PharmD, BCPS []  Pollyann SamplesAndy Johnston, PharmD, BCPS  Positive urine culture <50,000 colonies advised to d/c cipro  Angel MullMiller, Angel Benton 05/15/2016, 12:01 PM

## 2016-05-20 DIAGNOSIS — I8312 Varicose veins of left lower extremity with inflammation: Secondary | ICD-10-CM | POA: Diagnosis not present

## 2016-05-20 DIAGNOSIS — I8311 Varicose veins of right lower extremity with inflammation: Secondary | ICD-10-CM | POA: Diagnosis not present

## 2016-06-04 DIAGNOSIS — I8312 Varicose veins of left lower extremity with inflammation: Secondary | ICD-10-CM | POA: Diagnosis not present

## 2016-09-12 ENCOUNTER — Encounter: Payer: Self-pay | Admitting: *Deleted

## 2016-09-13 ENCOUNTER — Ambulatory Visit: Payer: Medicare Other | Admitting: Cardiovascular Disease

## 2016-09-17 DIAGNOSIS — H40053 Ocular hypertension, bilateral: Secondary | ICD-10-CM | POA: Diagnosis not present

## 2016-10-11 DIAGNOSIS — E669 Obesity, unspecified: Secondary | ICD-10-CM | POA: Diagnosis not present

## 2016-10-11 DIAGNOSIS — I10 Essential (primary) hypertension: Secondary | ICD-10-CM | POA: Diagnosis not present

## 2016-10-11 DIAGNOSIS — I259 Chronic ischemic heart disease, unspecified: Secondary | ICD-10-CM | POA: Diagnosis not present

## 2016-10-11 DIAGNOSIS — Z23 Encounter for immunization: Secondary | ICD-10-CM | POA: Diagnosis not present

## 2016-10-16 ENCOUNTER — Encounter: Payer: Self-pay | Admitting: Cardiovascular Disease

## 2016-10-16 ENCOUNTER — Ambulatory Visit (INDEPENDENT_AMBULATORY_CARE_PROVIDER_SITE_OTHER): Payer: Medicare Other | Admitting: Cardiovascular Disease

## 2016-10-16 ENCOUNTER — Telehealth: Payer: Self-pay | Admitting: Cardiovascular Disease

## 2016-10-16 ENCOUNTER — Encounter: Payer: Self-pay | Admitting: *Deleted

## 2016-10-16 ENCOUNTER — Encounter (INDEPENDENT_AMBULATORY_CARE_PROVIDER_SITE_OTHER): Payer: Self-pay

## 2016-10-16 VITALS — BP 130/74 | HR 65 | Ht 63.0 in | Wt 185.0 lb

## 2016-10-16 DIAGNOSIS — I10 Essential (primary) hypertension: Secondary | ICD-10-CM

## 2016-10-16 DIAGNOSIS — R079 Chest pain, unspecified: Secondary | ICD-10-CM

## 2016-10-16 DIAGNOSIS — R42 Dizziness and giddiness: Secondary | ICD-10-CM

## 2016-10-16 DIAGNOSIS — R0609 Other forms of dyspnea: Secondary | ICD-10-CM

## 2016-10-16 DIAGNOSIS — R0602 Shortness of breath: Secondary | ICD-10-CM

## 2016-10-16 MED ORDER — NITROGLYCERIN 0.4 MG SL SUBL: 0.4000 mg | SUBLINGUAL_TABLET | SUBLINGUAL | 3 refills | Status: DC | PRN

## 2016-10-16 MED ORDER — AMLODIPINE BESYLATE 10 MG PO TABS: 10.0000 mg | ORAL_TABLET | Freq: Every day | ORAL | 6 refills | Status: AC

## 2016-10-16 NOTE — Progress Notes (Signed)
CARDIOLOGY CONSULT NOTE  Patient ID: Angel Benton MRN: [id number] DOB/AGE: 09/18/1950 66 y.o.  Admit date: (Not on file) Primary Physician: Kirstie Peri, MD Referring Physician: Dr. Sherryll Burger  Reason for Consultation: chest pain, exertional dyspnea, Hypertension  HPI: Angel Benton is a 66 y.o. female who is being seen today for the evaluation of chest pain, exertional dyspnea, and hypertension at the request of Kirstie Peri, MD.   She stopped taking amlodipine over 2 years ago. Her blood pressure got as high as 180/106 recently. Her PCP recently refilled amlodipine. She currently takes 5 mg every evening and 40 milligrams of lisinopril every morning. For the past 2 mornings systolic blood pressures have been 151 mmHg.  She has been experiencing retrosternal chest pains for over 2 years. They have begun to increase in frequency and radiate to her jaw and left arm and sometimes her right arm. She walks on a track and she experiences it initially but then begin to subside with further walking.  She has exertional dyspnea if she has to climb stairs or walk uphill.  2 months ago she had been reading a magazine and developed retrosternal chest pain. She borrowed her husband's nitroglycerin and it alleviated the pain.  She reportedly underwent coronary angiography in 2004 and was told she had a 50% blockage. I do not have that report.   Allergies  Allergen Reactions  . Penicillins     Swelling.    Current Outpatient Prescriptions  Medication Sig Dispense Refill  . amLODipine (NORVASC) 5 MG tablet Take 5 mg by mouth at bedtime.     Marland Kitchen aspirin EC 81 MG tablet Take 81 mg by mouth daily.    Marland Kitchen escitalopram (LEXAPRO) 5 MG tablet Take 5 mg by mouth daily.    Marland Kitchen ibuprofen (ADVIL,MOTRIN) 800 MG tablet Take 1 tablet (800 mg total) by mouth 3 (three) times daily. 21 tablet 0  . lisinopril (PRINIVIL,ZESTRIL) 40 MG tablet Take 40 mg by mouth daily.     No current facility-administered  medications for this visit.     Past Medical History:  Diagnosis Date  . GERD (gastroesophageal reflux disease)   . Hypertension     Past Surgical History:  Procedure Laterality Date  . CHOLECYSTECTOMY    . COLONOSCOPY N/A 10/29/2013   Procedure: COLONOSCOPY;  Surgeon: Malissa Hippo, MD;  Location: [facility];  Service: Endoscopy;  Laterality: N/A;  730  . SHOULDER ARTHROSCOPY Left    2014 [place]  . SHOULDER ARTHROSCOPY Right 2013   cone surgery    Social History   Social History  . Marital status: Married    Spouse name: N/A  . Number of children: N/A  . Years of education: N/A   Occupational History  . Not on file.   Social History Main Topics  . Smoking status: Never Smoker  . Smokeless tobacco: Never Used  . Alcohol use No  . Drug use: No  . Sexual activity: Not on file   Other Topics Concern  . Not on file   Social History Narrative  . No narrative on file     No family history of premature CAD in 1st degree relatives.  Current Meds  Medication Sig  . amLODipine (NORVASC) 5 MG tablet Take 5 mg by mouth at bedtime.   Marland Kitchen aspirin EC 81 MG tablet Take 81 mg by mouth daily.  Marland Kitchen escitalopram (LEXAPRO) 5 MG tablet Take 5 mg by mouth daily.  Marland Kitchen  ibuprofen (ADVIL,MOTRIN) 800 MG tablet Take 1 tablet (800 mg total) by mouth 3 (three) times daily.  Marland Kitchen lisinopril (PRINIVIL,ZESTRIL) 40 MG tablet Take 40 mg by mouth daily.  . [DISCONTINUED] ALPRAZolam (XANAX) 0.25 MG tablet Take 0.25 mg by mouth daily as needed for anxiety.  . [DISCONTINUED] dexlansoprazole (DEXILANT) 60 MG capsule Take 60 mg by mouth daily.      Review of systems complete and found to be negative unless listed above in HPI    Physical exam Blood pressure 130/74, pulse 65, height  (1.6 m), weight 185 lb (83.9 kg), SpO2 98 %. General: NAD Neck: No JVD, no thyromegaly or thyroid nodule.  Lungs: Clear to auscultation bilaterally with normal respiratory effort. CV: Nondisplaced  PMI. Regular rate and rhythm, normal S1/S2, no S3/S4, no murmur.  No peripheral edema.  No carotid bruit.    Abdomen: Soft, nontender, no distention.  Skin: Intact without lesions or rashes.  Neurologic: Alert and oriented x 3.  Psych: Normal affect. Extremities: No clubbing or cyanosis.  HEENT: Normal.   ECG: Most recent ECG reviewed.   Labs: Lab Results  Component Value Date/Time   K 3.3 (L) 05/11/2016 11:30 AM   BUN 18 05/11/2016 11:30 AM   CREATININE 0.96 05/11/2016 11:30 AM   ALT 16 05/11/2016 11:30 AM   HGB 13.5 05/11/2016 11:30 AM     Lipids: No results found for: LDLCALC, LDLDIRECT, CHOL, TRIG, HDL      ASSESSMENT AND PLAN:  1. Chest pain and exertional dyspnea: Symptoms are suspicious for ischemic heart disease in that they occur with exertion and are alleviated with nitroglycerin.  They may also be due to significantly elevated blood pressure with exertion.  I will obtain an exercise Myoview nuclear stress test. I will also obtain an echocardiogram to evaluate cardiac structure and function. I will aim to control blood pressure by increasing her evening dose of amlodipine to 10 mg. I will also prescribe sublingual nitroglycerin. I will try and obtain a copy of her cardiac catheterization report from 2004.  2. Hypertension: This remains elevated.  I will aim to control blood pressure by increasing her evening dose of amlodipine to 10 mg. continue lisinopril 40 mg every morning. I will obtain an echocardiogram to determine if she has developed hypertensive heart disease.   Disposition: Follow up in 3 weeks.   Signed: Prentice Docker, M.D., F.A.C.C.  10/16/2016, 3:04 PM

## 2016-10-16 NOTE — Patient Instructions (Addendum)
Medication Instructions:   Increase Amlodipine to  every evening.   Begin Nitroglycerin as needed for severe chest pain only.  Continue all other medications.    Labwork: none  Testing/Procedures:  Your physician has requested that you have an echocardiogram. Echocardiography is a painless test that uses sound waves to create images of your heart. It provides your doctor with information about the size and shape of your heart and how well your heart's chambers and valves are working. This procedure takes approximately one hour. There are no restrictions for this procedure.  Your physician has requested that you have en exercise stress myoview. For further information please visit [email or web address] Please follow instruction sheet, as given.  Office will contact with results via phone or letter.    Follow-Up: 3 weeks   Any Other Special Instructions Will Be Listed Below (If Applicable).  If you need a refill on your cardiac medications before your next appointment, please call your pharmacy.

## 2016-10-16 NOTE — Telephone Encounter (Signed)
Pre-cert Verification for the following procedure   Echo scheduled for 10-17-2016  Stress test scheduled for 10-25-16

## 2016-10-17 ENCOUNTER — Other Ambulatory Visit: Payer: Self-pay

## 2016-10-17 ENCOUNTER — Ambulatory Visit (INDEPENDENT_AMBULATORY_CARE_PROVIDER_SITE_OTHER): Payer: Medicare Other

## 2016-10-17 DIAGNOSIS — R0609 Other forms of dyspnea: Secondary | ICD-10-CM | POA: Diagnosis not present

## 2016-10-17 DIAGNOSIS — R0602 Shortness of breath: Secondary | ICD-10-CM | POA: Diagnosis not present

## 2016-10-17 DIAGNOSIS — R079 Chest pain, unspecified: Secondary | ICD-10-CM | POA: Diagnosis not present

## 2016-10-18 LAB — ECHOCARDIOGRAM COMPLETE
CHL CUP MV DEC (S): 187
CHL CUP REG VEL DIAS: 95 cm/s
CHL CUP RV SYS PRESS: 20 mmHg
CHL CUP TV REG PEAK VELOCITY: 207 cm/s
E/e' ratio: 7.45
EWDT: 187 ms
FS: 32 % (ref 28–44)
IVS/LV PW RATIO, ED: 0.81
LA ID, A-P, ES: 37 mm
LA diam end sys: 37 mm
LA diam index: 1.98 cm/m2
LA vol A4C: 25 ml
LA vol: 36.9 mL
LAVOLIN: 19.7 mL/m2
LV E/e'average: 7.45
LV TDI E'LATERAL: 10.8
LV TDI E'MEDIAL: 6.09
LV e' LATERAL: 10.8 cm/s
LVEEMED: 7.45
LVOT VTI: 24.2 cm
LVOT area: 2.84 cm2
LVOTD: 19 mm
LVOTPV: 99.8 cm/s
LVOTSV: 69 mL
Lateral S' vel: 11.5 cm/s
MV Peak grad: 3 mmHg
MV pk A vel: 74.2 m/s
MV pk E vel: 80.5 m/s
PW: 10.9 mm — AB (ref 0.6–1.1)
TAPSE: 23.9 mm
TR max vel: 207 cm/s

## 2016-10-21 ENCOUNTER — Telehealth: Payer: Self-pay | Admitting: *Deleted

## 2016-10-21 NOTE — Telephone Encounter (Signed)
Patient informed. 

## 2016-10-21 NOTE — Telephone Encounter (Signed)
-----   Message from Laqueta Linden, MD sent at 10/18/2016  8:45 AM EDT ----- Normal cardiac function.

## 2016-10-22 ENCOUNTER — Encounter: Payer: Self-pay | Admitting: *Deleted

## 2016-10-23 DIAGNOSIS — Z299 Encounter for prophylactic measures, unspecified: Secondary | ICD-10-CM | POA: Diagnosis not present

## 2016-10-23 DIAGNOSIS — I259 Chronic ischemic heart disease, unspecified: Secondary | ICD-10-CM | POA: Diagnosis not present

## 2016-10-23 DIAGNOSIS — E669 Obesity, unspecified: Secondary | ICD-10-CM | POA: Diagnosis not present

## 2016-10-23 DIAGNOSIS — I10 Essential (primary) hypertension: Secondary | ICD-10-CM | POA: Diagnosis not present

## 2016-10-23 DIAGNOSIS — E785 Hyperlipidemia, unspecified: Secondary | ICD-10-CM | POA: Diagnosis not present

## 2016-10-25 ENCOUNTER — Encounter (HOSPITAL_COMMUNITY)
Admission: RE | Admit: 2016-10-25 | Discharge: 2016-10-25 | Disposition: A | Discharge status: Home / Self Care | Payer: Medicare Other | Source: Ambulatory Visit | Attending: Cardiovascular Disease | Admitting: Cardiovascular Disease

## 2016-10-25 ENCOUNTER — Ambulatory Visit (HOSPITAL_COMMUNITY)
Admission: RE | Admit: 2016-10-25 | Discharge: 2016-10-25 | Disposition: A | Discharge status: Home / Self Care | Payer: Medicare Other | Source: Ambulatory Visit | Attending: Internal Medicine | Admitting: Internal Medicine

## 2016-10-25 DIAGNOSIS — R0602 Shortness of breath: Secondary | ICD-10-CM

## 2016-10-25 DIAGNOSIS — R0609 Other forms of dyspnea: Secondary | ICD-10-CM

## 2016-10-25 DIAGNOSIS — R079 Chest pain, unspecified: Secondary | ICD-10-CM

## 2016-11-11 ENCOUNTER — Ambulatory Visit: Payer: Medicare Other | Admitting: Cardiovascular Disease

## 2016-11-13 ENCOUNTER — Encounter (HOSPITAL_COMMUNITY)
Admission: RE | Admit: 2016-11-13 | Discharge: 2016-11-13 | Disposition: A | Discharge status: Home / Self Care | Payer: Medicare Other | Source: Ambulatory Visit | Attending: Cardiovascular Disease | Admitting: Cardiovascular Disease

## 2016-11-13 ENCOUNTER — Encounter (HOSPITAL_COMMUNITY): Payer: Self-pay

## 2016-11-13 ENCOUNTER — Encounter (HOSPITAL_BASED_OUTPATIENT_CLINIC_OR_DEPARTMENT_OTHER)
Admission: RE | Admit: 2016-11-13 | Discharge: 2016-11-13 | Disposition: A | Discharge status: Home / Self Care | Payer: Medicare Other | Source: Ambulatory Visit | Attending: Cardiovascular Disease | Admitting: Cardiovascular Disease

## 2016-11-13 DIAGNOSIS — R0602 Shortness of breath: Secondary | ICD-10-CM

## 2016-11-13 DIAGNOSIS — R079 Chest pain, unspecified: Secondary | ICD-10-CM | POA: Insufficient documentation

## 2016-11-13 DIAGNOSIS — R0609 Other forms of dyspnea: Secondary | ICD-10-CM | POA: Insufficient documentation

## 2016-11-13 LAB — NM MYOCAR MULTI W/SPECT W/WALL MOTION / EF
CHL CUP MPHR: 155 {beats}/min
CHL CUP NUCLEAR SDS: 0
CSEPHR: 90 %
CSEPPHR: 141 {beats}/min
Estimated workload: 9.3 METS
Exercise duration (min): 7 min
Exercise duration (sec): 11 s
LHR: 0.33
LVDIAVOL: 71 mL (ref 46–106)
LVSYSVOL: 27 mL
NUC STRESS TID: 0.89
RPE: 18
Rest HR: 65 {beats}/min
SRS: 0
SSS: 0

## 2016-11-13 MED ORDER — TECHNETIUM TC 99M TETROFOSMIN IV KIT
10.0000 | PACK | Freq: Once | INTRAVENOUS | Status: AC | PRN
  Administered 2016-11-13: 9.3 via INTRAVENOUS

## 2016-11-13 MED ORDER — REGADENOSON 0.4 MG/5ML IV SOLN
INTRAVENOUS | Status: AC
  Filled 2016-11-13: qty 5

## 2016-11-13 MED ORDER — TECHNETIUM TC 99M TETROFOSMIN IV KIT
30.0000 | PACK | Freq: Once | INTRAVENOUS | Status: AC | PRN
  Administered 2016-11-13: 28 via INTRAVENOUS

## 2016-11-13 MED ORDER — SODIUM CHLORIDE 0.9% FLUSH
INTRAVENOUS | Status: AC
  Administered 2016-11-13: 10 mL via INTRAVENOUS
  Filled 2016-11-13: qty 10

## 2016-11-15 ENCOUNTER — Ambulatory Visit (INDEPENDENT_AMBULATORY_CARE_PROVIDER_SITE_OTHER): Payer: Medicare Other | Admitting: Cardiovascular Disease

## 2016-11-15 ENCOUNTER — Encounter: Payer: Self-pay | Admitting: Cardiovascular Disease

## 2016-11-15 VITALS — BP 142/88 | HR 60 | Ht 62.5 in | Wt 187.0 lb

## 2016-11-15 DIAGNOSIS — R079 Chest pain, unspecified: Secondary | ICD-10-CM | POA: Diagnosis not present

## 2016-11-15 DIAGNOSIS — I10 Essential (primary) hypertension: Secondary | ICD-10-CM | POA: Diagnosis not present

## 2016-11-15 DIAGNOSIS — E876 Hypokalemia: Secondary | ICD-10-CM

## 2016-11-15 DIAGNOSIS — R0609 Other forms of dyspnea: Secondary | ICD-10-CM

## 2016-11-15 MED ORDER — CHLORTHALIDONE 25 MG PO TABS: 25.0000 mg | ORAL_TABLET | Freq: Every day | ORAL | 6 refills | Status: DC

## 2016-11-15 NOTE — Patient Instructions (Signed)
Medication Instructions:   Begin Chlorthalidone 25mg  daily.   Continue all other medications.    Labwork:  BMET - order given today.  Do Monday, 11/18/2016.  Office will contact with results via phone or letter.    Testing/Procedures: none  Follow-Up: 1 month   Any Other Special Instructions Will Be Listed Below (If Applicable).  If you need a refill on your cardiac medications before your next appointment, please call your pharmacy.

## 2016-11-15 NOTE — Progress Notes (Signed)
SUBJECTIVE: The patient returns for follow-up after undergoing cardiovascular testing performed for the evaluation of chest pain and exertional dyspnea.  Nuclear stress test was low risk with no evidence of myocardial ischemia or scar.  She had nonlimiting chest pain and a hypertensive response to exercise with an intermediate risk Duke treadmill score.  Echocardiogram demonstrated normal systolic and diastolic function, LVEF 60-65%.  Prior to her stress test she had an episode of chest pressure at rest for which she took 2 sublingual nitroglycerin before she had relief of symptoms.  Potassium was low at 3.3 on 05/11/16.  She does not think she has had it repeated since then.   Review of Systems: As per "subjective", otherwise negative.  Allergies  Allergen Reactions  . Penicillins     Swelling.    Current Outpatient Medications  Medication Sig Dispense Refill  . amLODipine (NORVASC) 10 MG tablet Take 1 tablet (10 mg total) by mouth at bedtime. 30 tablet 6  . aspirin EC 81 MG tablet Take 81 mg by mouth daily.    Marland Kitchen. escitalopram (LEXAPRO) 5 MG tablet Take 5 mg by mouth daily.    Marland Kitchen. ibuprofen (ADVIL,MOTRIN) 800 MG tablet Take 1 tablet (800 mg total) by mouth 3 (three) times daily. 21 tablet 0  . lisinopril (PRINIVIL,ZESTRIL) 40 MG tablet Take 40 mg by mouth daily.    . nitroGLYCERIN (NITROSTAT) 0.4 MG SL tablet Place 1 tablet (0.4 mg total) under the tongue every 5 (five) minutes as needed for chest pain. 25 tablet 3   No current facility-administered medications for this visit.     Past Medical History:  Diagnosis Date  . GERD (gastroesophageal reflux disease)   . Hypertension     Past Surgical History:  Procedure Laterality Date  . CHOLECYSTECTOMY    . SHOULDER ARTHROSCOPY Left    2014 [place]  . SHOULDER ARTHROSCOPY Right 2013   cone surgery    Social History   Socioeconomic History  . Marital status: Married    Spouse name: Not on file  . Number  of children: Not on file  . Years of education: Not on file  . Highest education level: Not on file  Social Needs  . Financial resource strain: Not on file  . Food insecurity - worry: Not on file  . Food insecurity - inability: Not on file  . Transportation needs - medical: Not on file  . Transportation needs - non-medical: Not on file  Occupational History  . Not on file  Tobacco Use  . Smoking status: Never Smoker  . Smokeless tobacco: Never Used  Substance and Sexual Activity  . Alcohol use: No  . Drug use: No  . Sexual activity: Not on file  Other Topics Concern  . Not on file  Social History Narrative  . Not on file     Vitals:   11/15/16 1410  BP: (!) 142/88  Pulse: 60  SpO2: 98%  Weight: 187 lb (84.8 kg)  Height: 5' 2.5" (1.588 m)    Wt Readings from Last 3 Encounters:  11/15/16 187 lb (84.8 kg)  10/16/16 185 lb (83.9 kg)  10/29/13 178 lb (80.7 kg)     PHYSICAL EXAM General: NAD HEENT: Normal. Neck: No JVD, no thyromegaly. Lungs: Clear to auscultation bilaterally with normal respiratory effort. CV: Regular rate and rhythm, normal S1/S2, no S3/S4, no murmur. No pretibial or periankle edema. Abdomen: Soft, nontender, no distention.  Neurologic: Alert and oriented.  Psych:  Normal affect. Skin: Normal. Musculoskeletal: No gross deformities.    ECG: Most recent ECG reviewed.   Labs: Lab Results  Component Value Date/Time   K 3.3 (L) 05/11/2016 11:30 AM   BUN 18 05/11/2016 11:30 AM   CREATININE 0.96 05/11/2016 11:30 AM   ALT 16 05/11/2016 11:30 AM   HGB 13.5 05/11/2016 11:30 AM     Lipids: No results found for: LDLCALC, LDLDIRECT, CHOL, TRIG, HDL     ASSESSMENT AND PLAN: 1. Chest pain and exertional dyspnea: Symptoms are suspicious for ischemic heart disease in that they occur with exertion and are alleviated with nitroglycerin.   She also had an episode before her stress test which occurred at rest.  However, as detailed above, there is no  evidence of myocardial ischemia or scar with stress testing. Hence, symptoms may be due to significantly elevated blood pressure with and without exertion.  I will add chlorthalidone 25 mg daily.  I will check a basic metabolic panel in 3 days.  If symptoms are not relieved in spite of optimal blood pressure control, I would consider coronary angiography.  2. Hypertension: This remains elevated. I will add chlorthalidone 25 mg daily.  I will check a basic metabolic panel in 3 days.  She is already taking 10 mg of amlodipine and 40 mg of lisinopril. I have asked the patient to check blood pressure readings 4 times per week, at different times throughout the day, in order to get a better approximation of mean BP values. These results will be provided to me at the end of that period so that I can determine if antihypertensive medication titration is indicated.  3.  Hypokalemia: Potassium is 3.3 in early May 2018.  I am repeating a basic metabolic panel in 3 days after she has been on chlorthalidone.   Disposition: Follow up 1 month   Prentice DockerSuresh Libra Gatz, M.D., F.A.C.C.

## 2016-11-18 DIAGNOSIS — I10 Essential (primary) hypertension: Secondary | ICD-10-CM | POA: Diagnosis not present

## 2016-11-18 LAB — BASIC METABOLIC PANEL
BUN: 16 mg/dL (ref 7–25)
CALCIUM: 9.1 mg/dL (ref 8.6–10.4)
CHLORIDE: 105 mmol/L (ref 98–110)
CO2: 28 mmol/L (ref 20–32)
Creat: 0.76 mg/dL (ref 0.50–0.99)
Glucose, Bld: 90 mg/dL (ref 65–99)
POTASSIUM: 3.6 mmol/L (ref 3.5–5.3)
Sodium: 140 mmol/L (ref 135–146)

## 2016-11-20 ENCOUNTER — Telehealth: Payer: Self-pay | Admitting: *Deleted

## 2016-11-20 NOTE — Telephone Encounter (Signed)
Notes recorded by Lesle ChrisHill, Angela G, LPN on 16/10/960411/14/2018 at 1:49 PM EST Patient notified. Copy to pmd. Follow up scheduled for 11/08/2016. ------  Notes recorded by Laqueta LindenKoneswaran, Suresh A, MD on 11/19/2016 at 1:40 PM EST Normal.

## 2016-12-02 ENCOUNTER — Ambulatory Visit (INDEPENDENT_AMBULATORY_CARE_PROVIDER_SITE_OTHER): Payer: Medicare Other | Admitting: Cardiovascular Disease

## 2016-12-02 ENCOUNTER — Encounter: Payer: Self-pay | Admitting: Cardiovascular Disease

## 2016-12-02 VITALS — BP 122/78 | HR 80 | Ht 62.0 in | Wt 186.0 lb

## 2016-12-02 DIAGNOSIS — E876 Hypokalemia: Secondary | ICD-10-CM

## 2016-12-02 DIAGNOSIS — I10 Essential (primary) hypertension: Secondary | ICD-10-CM | POA: Diagnosis not present

## 2016-12-02 DIAGNOSIS — R0609 Other forms of dyspnea: Secondary | ICD-10-CM

## 2016-12-02 DIAGNOSIS — R079 Chest pain, unspecified: Secondary | ICD-10-CM | POA: Diagnosis not present

## 2016-12-02 MED ORDER — CHLORTHALIDONE 25 MG PO TABS: 12.5000 mg | ORAL_TABLET | Freq: Every day | ORAL | Status: DC

## 2016-12-02 NOTE — Progress Notes (Signed)
SUBJECTIVE: The patient presents for follow-up of chest pain, exertional dyspnea, and hypertension.  In summary, nuclear stress test was low risk with no evidence of myocardial ischemia or scar.  She had nonlimiting chest pain and a hypertensive response to exercise with an intermediate risk Duke treadmill score.  Echocardiogram demonstrated normal systolic and diastolic function, LVEF 60-65%.  Prior to her stress test she had an episode of chest pressure at rest for which she took 2 sublingual nitroglycerin before she had relief of symptoms.  She is doing much better now.  She has not been walking much but did try walking today and denied any exertional chest pain and shortness of breath.  Blood pressures at home have ranged 107-111/72-75.  There was one day where she did not feel well and blood pressure was 95/68.  She is anxious about her husband's blood test results and need to see a hematologist-oncologist next week.   Review of Systems: As per "subjective", otherwise negative.  Allergies  Allergen Reactions  . Penicillins     Swelling.    Current Outpatient Medications  Medication Sig Dispense Refill  . amLODipine (NORVASC) 10 MG tablet Take 1 tablet (10 mg total) by mouth at bedtime. 30 tablet 6  . aspirin EC 81 MG tablet Take 81 mg by mouth daily.    . chlorthalidone (HYGROTON) 25 MG tablet Take 1 tablet (25 mg total) daily by mouth. 30 tablet 6  . escitalopram (LEXAPRO) 5 MG tablet Take 5 mg by mouth daily.    Marland Kitchen. ibuprofen (ADVIL,MOTRIN) 800 MG tablet Take 1 tablet (800 mg total) by mouth 3 (three) times daily. 21 tablet 0  . lisinopril (PRINIVIL,ZESTRIL) 40 MG tablet Take 40 mg by mouth daily.    . nitroGLYCERIN (NITROSTAT) 0.4 MG SL tablet Place 1 tablet (0.4 mg total) under the tongue every 5 (five) minutes as needed for chest pain. 25 tablet 3   No current facility-administered medications for this visit.     Past Medical History:  Diagnosis Date  . GERD  (gastroesophageal reflux disease)   . Hypertension     Past Surgical History:  Procedure Laterality Date  . CHOLECYSTECTOMY    . COLONOSCOPY N/A 10/29/2013   Procedure: COLONOSCOPY;  Surgeon: Malissa HippoNajeeb U Rehman, MD;  Location: [facility];  Service: Endoscopy;  Laterality: N/A;  730  . SHOULDER ARTHROSCOPY Left    2014 [place]  . SHOULDER ARTHROSCOPY Right 2013   cone surgery    Social History   Socioeconomic History  . Marital status: Married    Spouse name: Not on file  . Number of children: Not on file  . Years of education: Not on file  . Highest education level: Not on file  Social Needs  . Financial resource strain: Not on file  . Food insecurity - worry: Not on file  . Food insecurity - inability: Not on file  . Transportation needs - medical: Not on file  . Transportation needs - non-medical: Not on file  Occupational History  . Not on file  Tobacco Use  . Smoking status: Never Smoker  . Smokeless tobacco: Never Used  Substance and Sexual Activity  . Alcohol use: No  . Drug use: No  . Sexual activity: Not on file  Other Topics Concern  . Not on file  Social History Narrative  . Not on file     Vitals:   12/02/16 1556  BP: 122/78  Pulse: 80  SpO2:  95%  Weight: 186 lb (84.4 kg)  Height: 5\' 2"  (1.575 m)    Wt Readings from Last 3 Encounters:  12/02/16 186 lb (84.4 kg)  11/15/16 187 lb (84.8 kg)  10/16/16 185 lb (83.9 kg)     PHYSICAL EXAM General: NAD HEENT: Normal. Neck: No JVD, no thyromegaly. Lungs: Clear to auscultation bilaterally with normal respiratory effort. CV: Regular rate and rhythm, normal S1/S2, no S3/S4, no murmur. No pretibial or periankle edema.  No carotid bruit.   Abdomen: Soft, nontender, no distention.  Neurologic: Alert and oriented.  Psych: Normal affect. Skin: Normal. Musculoskeletal: No gross deformities.    ECG: Most recent ECG reviewed.   Labs: Lab Results  Component Value Date/Time   K 3.6  11/18/2016 01:07 PM   BUN 16 11/18/2016 01:07 PM   CREATININE 0.76 11/18/2016 01:07 PM   ALT 16 05/11/2016 11:30 AM   HGB 13.5 05/11/2016 11:30 AM     Lipids: No results found for: LDLCALC, LDLDIRECT, CHOL, TRIG, HDL     ASSESSMENT AND PLAN:  1. Chest pain and exertional dyspnea: Symptoms have improved with blood pressure control, and were suspicious for ischemic heart disease in that they occur with exertion and are alleviated with nitroglycerin.  She also had an episode before her stress test which occurred at rest.  However, as detailed above, there is no evidence of myocardial ischemia or scar with stress testing. Hence, symptoms may be due to significantly elevated blood pressure with and without exertion.  If symptoms are not relieved in spite of optimal blood pressure control, I would consider coronary angiography.  2. Hypertension: Blood pressure is normal today and pressures have been normal to low normal at home.  Continue chlorthalidone (I will reduce to 12.5 mg daily), amlodipine, and lisinopril.  I have asked her to continue to monitor blood pressures 3-4 times per week.  3.  Hypokalemia: Normal when most recently checked by me.    Disposition: Follow up 3 months   Prentice DockerSuresh Huntley Demedeiros, M.D., F.A.C.C.

## 2016-12-02 NOTE — Patient Instructions (Signed)
Medication Instructions:   Decrease Chlorthalidone to 12.5mg  daily.  Continue all other medications.    Labwork: none  Testing/Procedures: none  Follow-Up: 3 months   Any Other Special Instructions Will Be Listed Below (If Applicable).  If you need a refill on your cardiac medications before your next appointment, please call your pharmacy.

## 2016-12-04 ENCOUNTER — Ambulatory Visit: Payer: Medicare Other | Admitting: Cardiovascular Disease

## 2016-12-26 DIAGNOSIS — H43811 Vitreous degeneration, right eye: Secondary | ICD-10-CM | POA: Diagnosis not present

## 2016-12-26 DIAGNOSIS — H43393 Other vitreous opacities, bilateral: Secondary | ICD-10-CM | POA: Diagnosis not present

## 2017-01-31 DIAGNOSIS — I8312 Varicose veins of left lower extremity with inflammation: Secondary | ICD-10-CM | POA: Diagnosis not present

## 2017-02-10 DIAGNOSIS — Z1231 Encounter for screening mammogram for malignant neoplasm of breast: Secondary | ICD-10-CM | POA: Diagnosis not present

## 2017-02-21 DIAGNOSIS — I8312 Varicose veins of left lower extremity with inflammation: Secondary | ICD-10-CM | POA: Diagnosis not present

## 2017-03-14 ENCOUNTER — Encounter: Payer: Self-pay | Admitting: Cardiovascular Disease

## 2017-03-14 ENCOUNTER — Other Ambulatory Visit: Payer: Self-pay | Admitting: Cardiovascular Disease

## 2017-03-14 ENCOUNTER — Encounter: Payer: Self-pay | Admitting: *Deleted

## 2017-03-14 ENCOUNTER — Other Ambulatory Visit (HOSPITAL_COMMUNITY)
Admission: RE | Admit: 2017-03-14 | Discharge: 2017-03-14 | Disposition: A | Discharge status: Home / Self Care | Payer: Medicare Other | Source: Ambulatory Visit | Attending: Cardiovascular Disease | Admitting: Cardiovascular Disease

## 2017-03-14 ENCOUNTER — Ambulatory Visit (HOSPITAL_COMMUNITY)
Admission: RE | Admit: 2017-03-14 | Discharge: 2017-03-14 | Disposition: A | Discharge status: Home / Self Care | Payer: Medicare Other | Source: Ambulatory Visit | Attending: Cardiovascular Disease | Admitting: Cardiovascular Disease

## 2017-03-14 ENCOUNTER — Ambulatory Visit (INDEPENDENT_AMBULATORY_CARE_PROVIDER_SITE_OTHER): Payer: Medicare Other | Admitting: Cardiovascular Disease

## 2017-03-14 ENCOUNTER — Telehealth: Payer: Self-pay | Admitting: Cardiovascular Disease

## 2017-03-14 VITALS — BP 122/62 | HR 64 | Ht 63.0 in | Wt 186.0 lb

## 2017-03-14 DIAGNOSIS — R0609 Other forms of dyspnea: Secondary | ICD-10-CM

## 2017-03-14 DIAGNOSIS — Z01812 Encounter for preprocedural laboratory examination: Secondary | ICD-10-CM | POA: Insufficient documentation

## 2017-03-14 DIAGNOSIS — I209 Angina pectoris, unspecified: Secondary | ICD-10-CM

## 2017-03-14 DIAGNOSIS — E876 Hypokalemia: Secondary | ICD-10-CM | POA: Diagnosis not present

## 2017-03-14 DIAGNOSIS — R079 Chest pain, unspecified: Secondary | ICD-10-CM

## 2017-03-14 DIAGNOSIS — Z01818 Encounter for other preprocedural examination: Secondary | ICD-10-CM | POA: Diagnosis not present

## 2017-03-14 DIAGNOSIS — R0602 Shortness of breath: Secondary | ICD-10-CM

## 2017-03-14 DIAGNOSIS — I10 Essential (primary) hypertension: Secondary | ICD-10-CM

## 2017-03-14 LAB — BASIC METABOLIC PANEL
ANION GAP: 10 (ref 5–15)
BUN: 21 mg/dL — ABNORMAL HIGH (ref 6–20)
CHLORIDE: 105 mmol/L (ref 101–111)
CO2: 26 mmol/L (ref 22–32)
Calcium: 9.5 mg/dL (ref 8.9–10.3)
Creatinine, Ser: 0.78 mg/dL (ref 0.44–1.00)
GFR calc non Af Amer: >60 mL/min (ref >60)
GLUCOSE: 100 mg/dL — AB (ref 65–99)
POTASSIUM: 3.9 mmol/L (ref 3.5–5.1)
Sodium: 141 mmol/L (ref 135–145)

## 2017-03-14 LAB — CBC
HEMATOCRIT: 43.1 % (ref 36.0–46.0)
HEMOGLOBIN: 13.9 g/dL (ref 12.0–15.0)
MCH: 30.9 pg (ref 26.0–34.0)
MCHC: 32.3 g/dL (ref 30.0–36.0)
MCV: 95.8 fL (ref 78.0–100.0)
Platelets: 359 10*3/uL (ref 150–400)
RBC: 4.5 MIL/uL (ref 3.87–5.11)
RDW: 12.4 % (ref 11.5–15.5)
WBC: 7.5 10*3/uL (ref 4.0–10.5)

## 2017-03-14 LAB — PROTIME-INR
INR: 0.93
Prothrombin Time: 12.4 seconds (ref 11.4–15.2)

## 2017-03-14 LAB — APTT: APTT: 27 s (ref 24–36)

## 2017-03-14 NOTE — Telephone Encounter (Signed)
Pre-cert Verification for the following procedure   Left heart cath - angina pectoris - [place] - 03/18/17 - 11:00

## 2017-03-14 NOTE — Patient Instructions (Addendum)
Medication Instructions:  Continue all current medications.  Labwork: BMET, CBC, PT, PTT - orders given today.  Testing/Procedures:  Your physician has requested that you have a cardiac catheterization. Cardiac catheterization is used to diagnose and/or treat various heart conditions. Doctors may recommend this procedure for a number of different reasons. The most common reason is to evaluate chest pain. Chest pain can be a symptom of coronary artery disease (CAD), and cardiac catheterization can show whether plaque is narrowing or blocking your heart's arteries. This procedure is also used to evaluate the valves, as well as measure the blood flow and oxygen levels in different parts of your heart. For further information please visit [email or web address] Please follow instruction sheet, as given.  A chest x-ray takes a picture of the organs and structures inside the chest, including the heart, lungs, and blood vessels. This test can show several things, including, whether the heart is enlarges; whether fluid is building up in the lungs; and whether pacemaker / defibrillator leads are still in place.  Follow-Up: 6 weeks   Any Other Special Instructions Will Be Listed Below (If Applicable).  If you need a refill on your cardiac medications before your next appointment, please call your pharmacy.

## 2017-03-14 NOTE — Progress Notes (Signed)
SUBJECTIVE: The patient presents for follow-up of chest pain, exertional dyspnea, and hypertension.  In summary, nuclear stress test was low risk with no evidence of myocardial ischemia or scar. She had nonlimiting chest pain and a hypertensive response to exercise with an intermediate risk Duke treadmill score.  Echocardiogram demonstrated normal systolic and diastolic function, LVEF 60-65%.  Prior to her stress test she had an episode of chest pressure at rest for which she took 2 sublingual nitroglycerin before she had relief of symptoms.  She brought in her blood pressure monitor and I reviewed several readings.  Overall they appear to be well controlled.  She usually takes chlorthalidone 12.5 mill grams every other day because sometimes when her blood pressure is in the 120/80 range she will become dizzy if she takes chlorthalidone.  She has had at least 2 episodes since her last visit with me where she has walked up a very slight incline and developed significant pain in her chest radiating to both shoulders and the base of her neck.       Review of Systems: As per "subjective", otherwise negative.  Allergies  Allergen Reactions  . Penicillins     Swelling.    Current Outpatient Medications  Medication Sig Dispense Refill  . amLODipine (NORVASC) 10 MG tablet Take 1 tablet (10 mg total) by mouth at bedtime. 30 tablet 6  . aspirin EC 81 MG tablet Take 81 mg by mouth daily.    . chlorthalidone (HYGROTON) 25 MG tablet Take 0.5 tablets (12.5 mg total) by mouth daily.    Marland Kitchen escitalopram (LEXAPRO) 5 MG tablet Take 5 mg by mouth daily.    Marland Kitchen ibuprofen (ADVIL,MOTRIN) 800 MG tablet Take 1 tablet (800 mg total) by mouth 3 (three) times daily. 21 tablet 0  . lisinopril (PRINIVIL,ZESTRIL) 40 MG tablet Take 40 mg by mouth daily.    . nitroGLYCERIN (NITROSTAT) 0.4 MG SL tablet Place 1 tablet (0.4 mg total) under the tongue every 5 (five) minutes as needed for chest pain. 25  tablet 3   No current facility-administered medications for this visit.     Past Medical History:  Diagnosis Date  . GERD (gastroesophageal reflux disease)   . Hypertension     Past Surgical History:  Procedure Laterality Date  . CHOLECYSTECTOMY    . COLONOSCOPY N/A 10/29/2013   Procedure: COLONOSCOPY;  Surgeon: Malissa Hippo, MD;  Location: [facility];  Service: Endoscopy;  Laterality: N/A;  730  . SHOULDER ARTHROSCOPY Left    2014 [place]  . SHOULDER ARTHROSCOPY Right 2013   cone surgery    Social History   Socioeconomic History  . Marital status: Married    Spouse name: Not on file  . Number of children: Not on file  . Years of education: Not on file  . Highest education level: Not on file  Social Needs  . Financial resource strain: Not on file  . Food insecurity - worry: Not on file  . Food insecurity - inability: Not on file  . Transportation needs - medical: Not on file  . Transportation needs - non-medical: Not on file  Occupational History  . Not on file  Tobacco Use  . Smoking status: Never Smoker  . Smokeless tobacco: Never Used  Substance and Sexual Activity  . Alcohol use: No  . Drug use: No  . Sexual activity: Not on file  Other Topics Concern  . Not on file  Social History Narrative  .  Not on file     Vitals:   03/14/17 1301  BP: 122/62  Pulse: 64  SpO2: 98%  Weight: 186 lb (84.4 kg)  Height: 5\' 3"  (1.6 m)    Wt Readings from Last 3 Encounters:  03/14/17 186 lb (84.4 kg)  12/02/16 186 lb (84.4 kg)  11/15/16 187 lb (84.8 kg)     PHYSICAL EXAM General: NAD HEENT: Normal. Neck: No JVD, no thyromegaly. Lungs: Clear to auscultation bilaterally with normal respiratory effort. CV: Regular rate and rhythm, normal S1/S2, no S3/S4, no murmur. No pretibial or periankle edema.  No carotid bruit.   Abdomen: Soft, nontender, no distention.  Neurologic: Alert and oriented.  Psych: Normal affect. Skin:  Normal. Musculoskeletal: No gross deformities.    ECG: Most recent ECG reviewed.   Labs: Lab Results  Component Value Date/Time   K 3.6 11/18/2016 01:07 PM   BUN 16 11/18/2016 01:07 PM   CREATININE 0.76 11/18/2016 01:07 PM   ALT 16 05/11/2016 11:30 AM   HGB 13.5 05/11/2016 11:30 AM     Lipids: No results found for: LDLCALC, LDLDIRECT, CHOL, TRIG, HDL     ASSESSMENT AND PLAN:  1. Chest pain and exertional dyspnea (angina pectoris): She continues to have symptoms in spite of good blood pressure control which are suspicious for ischemic heart disease in that they occur with exertion and are alleviated with nitroglycerin and rest.She also had an episode before her stress test which occurred at rest. However, as detailed above, there is no evidence of myocardial ischemia or scar with stress testing. Given these repeated occurrences, I will arrange for coronary angiography for a more definitive evaluation.  Resting heart rate in the low 60 bpm range and thus I am unable to add beta-blockers.  Risks and benefits of cardiac catheterization have been discussed with the patient.  These include bleeding, infection, kidney damage, stroke, heart attack, death.  The patient understands these risks and is willing to proceed.  2. Hypertension: Blood pressure is normal today and pressures have been fairly well controlled at home.  Continue chlorthalidone 12.5 mg daily (she generally takes it every other day), amlodipine, and lisinopril.   3. Hypokalemia: Normal when most recently checked by me.     Disposition: Follow up 6 weeks  Time spent: 40 minutes, of which greater than 50% was spent reviewing symptoms, relevant blood tests and studies, and discussing management plan with the patient.    Prentice DockerSuresh Elly Haffey, M.D., F.A.C.C.

## 2017-03-14 NOTE — H&P (View-Only) (Signed)
SUBJECTIVE: The patient presents for follow-up of chest pain, exertional dyspnea, and hypertension.  In summary, nuclear stress test was low risk with no evidence of myocardial ischemia or scar. She had nonlimiting chest pain and a hypertensive response to exercise with an intermediate risk Duke treadmill score.  Echocardiogram demonstrated normal systolic and diastolic function, LVEF 60-65%.  Prior to her stress test she had an episode of chest pressure at rest for which she took 2 sublingual nitroglycerin before she had relief of symptoms.  She brought in her blood pressure monitor and I reviewed several readings.  Overall they appear to be well controlled.  She usually takes chlorthalidone 12.5 mill grams every other day because sometimes when her blood pressure is in the 120/80 range she will become dizzy if she takes chlorthalidone.  She has had at least 2 episodes since her last visit with me where she has walked up a very slight incline and developed significant pain in her chest radiating to both shoulders and the base of her neck.       Review of Systems: As per "subjective", otherwise negative.  Allergies  Allergen Reactions  . Penicillins     Swelling.    Current Outpatient Medications  Medication Sig Dispense Refill  . amLODipine (NORVASC) 10 MG tablet Take 1 tablet (10 mg total) by mouth at bedtime. 30 tablet 6  . aspirin EC 81 MG tablet Take 81 mg by mouth daily.    . chlorthalidone (HYGROTON) 25 MG tablet Take 0.5 tablets (12.5 mg total) by mouth daily.    Marland Kitchen escitalopram (LEXAPRO) 5 MG tablet Take 5 mg by mouth daily.    Marland Kitchen ibuprofen (ADVIL,MOTRIN) 800 MG tablet Take 1 tablet (800 mg total) by mouth 3 (three) times daily. 21 tablet 0  . lisinopril (PRINIVIL,ZESTRIL) 40 MG tablet Take 40 mg by mouth daily.    . nitroGLYCERIN (NITROSTAT) 0.4 MG SL tablet Place 1 tablet (0.4 mg total) under the tongue every 5 (five) minutes as needed for chest pain. 25  tablet 3   No current facility-administered medications for this visit.     Past Medical History:  Diagnosis Date  . GERD (gastroesophageal reflux disease)   . Hypertension     Past Surgical History:  Procedure Laterality Date  . CHOLECYSTECTOMY    . COLONOSCOPY N/A 10/29/2013   Procedure: COLONOSCOPY;  Surgeon: Malissa Hippo, MD;  Location: [facility];  Service: Endoscopy;  Laterality: N/A;  730  . SHOULDER ARTHROSCOPY Left    2014 [name]  . SHOULDER ARTHROSCOPY Right 2013   cone surgery    Social History   Socioeconomic History  . Marital status: Married    Spouse name: Not on file  . Number of children: Not on file  . Years of education: Not on file  . Highest education level: Not on file  Social Needs  . Financial resource strain: Not on file  . Food insecurity - worry: Not on file  . Food insecurity - inability: Not on file  . Transportation needs - medical: Not on file  . Transportation needs - non-medical: Not on file  Occupational History  . Not on file  Tobacco Use  . Smoking status: Never Smoker  . Smokeless tobacco: Never Used  Substance and Sexual Activity  . Alcohol use: No  . Drug use: No  . Sexual activity: Not on file  Other Topics Concern  . Not on file  Social History Narrative  .  Not on file     Vitals:   03/14/17 1301  BP: 122/62  Pulse: 64  SpO2: 98%  Weight: 186 lb (84.4 kg)  Height: 5\' 3"  (1.6 m)    Wt Readings from Last 3 Encounters:  03/14/17 186 lb (84.4 kg)  12/02/16 186 lb (84.4 kg)  11/15/16 187 lb (84.8 kg)     PHYSICAL EXAM General: NAD HEENT: Normal. Neck: No JVD, no thyromegaly. Lungs: Clear to auscultation bilaterally with normal respiratory effort. CV: Regular rate and rhythm, normal S1/S2, no S3/S4, no murmur. No pretibial or periankle edema.  No carotid bruit.   Abdomen: Soft, nontender, no distention.  Neurologic: Alert and oriented.  Psych: Normal affect. Skin:  Normal. Musculoskeletal: No gross deformities.    ECG: Most recent ECG reviewed.   Labs: Lab Results  Component Value Date/Time   K 3.6 11/18/2016 01:07 PM   BUN 16 11/18/2016 01:07 PM   CREATININE 0.76 11/18/2016 01:07 PM   ALT 16 05/11/2016 11:30 AM   HGB 13.5 05/11/2016 11:30 AM     Lipids: No results found for: LDLCALC, LDLDIRECT, CHOL, TRIG, HDL     ASSESSMENT AND PLAN:  1. Chest pain and exertional dyspnea (angina pectoris): She continues to have symptoms in spite of good blood pressure control which are suspicious for ischemic heart disease in that they occur with exertion and are alleviated with nitroglycerin and rest.She also had an episode before her stress test which occurred at rest. However, as detailed above, there is no evidence of myocardial ischemia or scar with stress testing. Given these repeated occurrences, I will arrange for coronary angiography for a more definitive evaluation.  Resting heart rate in the low 60 bpm range and thus I am unable to add beta-blockers.  Risks and benefits of cardiac catheterization have been discussed with the patient.  These include bleeding, infection, kidney damage, stroke, heart attack, death.  The patient understands these risks and is willing to proceed.  2. Hypertension: Blood pressure is normal today and pressures have been fairly well controlled at home.  Continue chlorthalidone 12.5 mg daily (she generally takes it every other day), amlodipine, and lisinopril.   3. Hypokalemia: Normal when most recently checked by me.     Disposition: Follow up 6 weeks  Time spent: 40 minutes, of which greater than 50% was spent reviewing symptoms, relevant blood tests and studies, and discussing management plan with the patient.    Prentice DockerSuresh Laneya Gasaway, M.D., F.A.C.C.

## 2017-03-17 ENCOUNTER — Telehealth: Payer: Self-pay | Admitting: *Deleted

## 2017-03-17 NOTE — Telephone Encounter (Signed)
CHEST X-RAY -  Notes recorded by Laqueta LindenKoneswaran, Suresh A, MD on 03/14/2017 at 3:42 PM EST Normal.   LABS -   Notes recorded by Laqueta LindenKoneswaran, Suresh A, MD on 03/14/2017 at 3:23 PM EST ok

## 2017-03-17 NOTE — Telephone Encounter (Signed)
Patient notified. Copy to pmd. Follow up scheduled for 04/30/17. (pre-cath)

## 2017-03-17 NOTE — Telephone Encounter (Addendum)
Pt contacted pre-catheterization scheduled at [facility] for: Tuesday March 12,2019 11 AM Verified arrival time and place: [facility] Main Entr[facility] A[facility] at: 9 AM Nothing to eat or drink after midnight prior to cath. Verified no diabetes medications. Verified allergies in Epic.  Hold: Chlorthalidone AM of cath  Except hold medications AM meds can be  taken pre-cath with sip of water including: ASA 81 mg  Confirmed patient has responsible person to drive home post procedure and observe patient for 24 hours: yes

## 2017-03-18 ENCOUNTER — Ambulatory Visit (HOSPITAL_COMMUNITY)
Admission: RE | Disposition: A | Discharge status: Home / Self Care | Payer: Self-pay | Source: Ambulatory Visit | Attending: Cardiovascular Disease

## 2017-03-18 ENCOUNTER — Ambulatory Visit (HOSPITAL_COMMUNITY)
Admission: RE | Admit: 2017-03-18 | Discharge: 2017-03-18 | Disposition: A | Discharge status: Home / Self Care | Payer: Medicare Other | Source: Ambulatory Visit | Attending: Cardiovascular Disease | Admitting: Cardiovascular Disease

## 2017-03-18 DIAGNOSIS — I10 Essential (primary) hypertension: Secondary | ICD-10-CM | POA: Insufficient documentation

## 2017-03-18 DIAGNOSIS — K219 Gastro-esophageal reflux disease without esophagitis: Secondary | ICD-10-CM | POA: Insufficient documentation

## 2017-03-18 DIAGNOSIS — Z79899 Other long term (current) drug therapy: Secondary | ICD-10-CM | POA: Insufficient documentation

## 2017-03-18 DIAGNOSIS — I2510 Atherosclerotic heart disease of native coronary artery without angina pectoris: Secondary | ICD-10-CM | POA: Diagnosis present

## 2017-03-18 DIAGNOSIS — Z7982 Long term (current) use of aspirin: Secondary | ICD-10-CM | POA: Insufficient documentation

## 2017-03-18 DIAGNOSIS — I209 Angina pectoris, unspecified: Secondary | ICD-10-CM

## 2017-03-18 DIAGNOSIS — Z9049 Acquired absence of other specified parts of digestive tract: Secondary | ICD-10-CM | POA: Insufficient documentation

## 2017-03-18 DIAGNOSIS — I25118 Atherosclerotic heart disease of native coronary artery with other forms of angina pectoris: Secondary | ICD-10-CM | POA: Diagnosis not present

## 2017-03-18 DIAGNOSIS — Z880 Allergy status to penicillin: Secondary | ICD-10-CM | POA: Insufficient documentation

## 2017-03-18 HISTORY — PX: LEFT HEART CATH AND CORONARY ANGIOGRAPHY: CATH118249

## 2017-03-18 LAB — POCT ACTIVATED CLOTTING TIME: ACTIVATED CLOTTING TIME: 169 s

## 2017-03-18 SURGERY — LEFT HEART CATH AND CORONARY ANGIOGRAPHY
Anesthesia: LOCAL

## 2017-03-18 MED ORDER — IOPAMIDOL (ISOVUE-370) INJECTION 76%
INTRAVENOUS | Status: AC
  Filled 2017-03-18: qty 100

## 2017-03-18 MED ORDER — NITROGLYCERIN 1 MG/10 ML FOR IR/CATH LAB
INTRA_ARTERIAL | Status: DC | PRN
  Administered 2017-03-18: 200 ug via INTRA_ARTERIAL

## 2017-03-18 MED ORDER — SODIUM CHLORIDE 0.9 % WEIGHT BASED INFUSION
3.0000 mL/kg/h | INTRAVENOUS | Status: AC
  Administered 2017-03-18: 3 mL/kg/h via INTRAVENOUS

## 2017-03-18 MED ORDER — SODIUM CHLORIDE 0.9 % IV SOLN: 250.0000 mL | INTRAVENOUS | Status: DC | PRN

## 2017-03-18 MED ORDER — HEPARIN SODIUM (PORCINE) 1000 UNIT/ML IJ SOLN
INTRAMUSCULAR | Status: DC | PRN
  Administered 2017-03-18: 4000 [IU] via INTRAVENOUS

## 2017-03-18 MED ORDER — MIDAZOLAM HCL 2 MG/2ML IJ SOLN
INTRAMUSCULAR | Status: AC
  Filled 2017-03-18: qty 2

## 2017-03-18 MED ORDER — SODIUM CHLORIDE 0.9% FLUSH: 3.0000 mL | Freq: Two times a day (BID) | INTRAVENOUS | Status: DC

## 2017-03-18 MED ORDER — HEPARIN (PORCINE) IN NACL 2-0.9 UNIT/ML-% IJ SOLN
INTRAMUSCULAR | Status: AC
  Filled 2017-03-18: qty 1000

## 2017-03-18 MED ORDER — SODIUM CHLORIDE 0.9% FLUSH: 3.0000 mL | INTRAVENOUS | Status: DC | PRN

## 2017-03-18 MED ORDER — HEPARIN SODIUM (PORCINE) 1000 UNIT/ML IJ SOLN
INTRAMUSCULAR | Status: AC
  Filled 2017-03-18: qty 1

## 2017-03-18 MED ORDER — ASPIRIN 81 MG PO CHEW: 81.0000 mg | CHEWABLE_TABLET | ORAL | Status: DC

## 2017-03-18 MED ORDER — SODIUM CHLORIDE 0.9 % WEIGHT BASED INFUSION
1.0000 mL/kg/h | INTRAVENOUS | Status: DC
  Administered 2017-03-18: 1 mL/kg/h via INTRAVENOUS

## 2017-03-18 MED ORDER — MIDAZOLAM HCL 2 MG/2ML IJ SOLN
INTRAMUSCULAR | Status: DC | PRN
  Administered 2017-03-18 (×3): 1 mg via INTRAVENOUS

## 2017-03-18 MED ORDER — LIDOCAINE HCL (PF) 1 % IJ SOLN
INTRAMUSCULAR | Status: AC
  Filled 2017-03-18: qty 30

## 2017-03-18 MED ORDER — FENTANYL CITRATE (PF) 100 MCG/2ML IJ SOLN
INTRAMUSCULAR | Status: AC
  Filled 2017-03-18: qty 2

## 2017-03-18 MED ORDER — LIDOCAINE HCL (PF) 1 % IJ SOLN
INTRAMUSCULAR | Status: DC | PRN
  Administered 2017-03-18: 2 mL via INTRADERMAL
  Administered 2017-03-18: 15 mL via INTRADERMAL

## 2017-03-18 MED ORDER — IOPAMIDOL (ISOVUE-370) INJECTION 76%
INTRAVENOUS | Status: DC | PRN
  Administered 2017-03-18: 50 mL via INTRA_ARTERIAL

## 2017-03-18 MED ORDER — VERAPAMIL HCL 2.5 MG/ML IV SOLN
INTRAVENOUS | Status: AC
  Filled 2017-03-18: qty 2

## 2017-03-18 MED ORDER — FENTANYL CITRATE (PF) 100 MCG/2ML IJ SOLN
INTRAMUSCULAR | Status: DC | PRN
  Administered 2017-03-18 (×3): 25 ug via INTRAVENOUS

## 2017-03-18 MED ORDER — VERAPAMIL HCL 2.5 MG/ML IV SOLN
INTRAVENOUS | Status: DC | PRN
  Administered 2017-03-18 (×2): 10 mL via INTRA_ARTERIAL

## 2017-03-18 MED ORDER — SODIUM CHLORIDE 0.9 % WEIGHT BASED INFUSION: 1.0000 mL/kg/h | INTRAVENOUS | Status: DC

## 2017-03-18 SURGICAL SUPPLY — 17 items
BAND CMPR LRG ZPHR (HEMOSTASIS) ×1
BAND ZEPHYR COMPRESS 30 LONG (HEMOSTASIS) ×1 IMPLANT
CATH IMPULSE 5F ANG/FL3.5 (CATHETERS) ×1 IMPLANT
CATH LAUNCHER 5F JR4 (CATHETERS) ×1 IMPLANT
COVER PRB 48X5XTLSCP FOLD TPE (BAG) IMPLANT
COVER PROBE 5X48 (BAG) ×2
GUIDEWIRE INQWIRE 1.5J.035X260 (WIRE) IMPLANT
INQWIRE 1.5J .035X260CM (WIRE) ×2
KIT HEART LEFT (KITS) ×2 IMPLANT
NDL PERC 21GX4CM (NEEDLE) IMPLANT
NEEDLE PERC 21GX4CM (NEEDLE) ×2 IMPLANT
PACK CARDIAC CATHETERIZATION (CUSTOM PROCEDURE TRAY) ×2 IMPLANT
SHEATH PINNACLE 5F 10CM (SHEATH) ×1 IMPLANT
SHEATH RAIN RADIAL 21G 6FR (SHEATH) ×1 IMPLANT
TRANSDUCER W/STOPCOCK (MISCELLANEOUS) ×2 IMPLANT
TUBING CIL FLEX 10 FLL-RA (TUBING) ×2 IMPLANT
WIRE HI TORQ VERSACORE-J 145CM (WIRE) ×1 IMPLANT

## 2017-03-18 NOTE — Research (Signed)
CAD FEM Informed Consent   Subject Name: Angel Benton  Subject met inclusion and exclusion criteria.  The informed consent form, study requirements and expectations were reviewed with the subject and questions and concerns were addressed prior to the signing of the consent form.  The subject verbalized understanding of the trail requirements.  The subject agreed to participate in the CAD FEM trial and signed the informed consent.  The informed consent was obtained prior to performance of any protocol-specific procedures for the subject.  A copy of the signed informed consent was given to the subject and a copy was placed in the subject's medical record.  Hedrick,Tammy W 03/18/2017, 10:00 AM

## 2017-03-18 NOTE — Discharge Instructions (Signed)
Radial Site Care °Refer to this sheet in the next few weeks. These instructions provide you with information about caring for yourself after your procedure. Your health care provider may also give you more specific instructions. Your treatment has been planned according to current medical practices, but problems sometimes occur. Call your health care provider if you have any problems or questions after your procedure. °What can I expect after the procedure? °After your procedure, it is typical to have the following: °· Bruising at the radial site that usually fades within 1-2 weeks. °· Blood collecting in the tissue (hematoma) that may be painful to the touch. It should usually decrease in size and tenderness within 1-2 weeks. ° °Follow these instructions at home: °· Take medicines only as directed by your health care provider. °· You may shower 24-48 hours after the procedure or as directed by your health care provider. Remove the bandage (dressing) and gently wash the site with plain soap and water. Pat the area dry with a clean towel. Do not rub the site, because this may cause bleeding. °· Do not take baths, swim, or use a hot tub until your health care provider approves. °· Check your insertion site every day for redness, swelling, or drainage. °· Do not apply powder or lotion to the site. °· Do not flex or bend the affected arm for 24 hours or as directed by your health care provider. °· Do not push or pull heavy objects with the affected arm for 24 hours or as directed by your health care provider. °· Do not lift over 10 lb (4.5 kg) for 5 days after your procedure or as directed by your health care provider. °· Ask your health care provider when it is okay to: °? Return to work or school. °? Resume usual physical activities or sports. °? Resume sexual activity. °· Do not drive home if you are discharged the same day as the procedure. Have someone else drive you. °· You may drive 24 hours after the procedure  unless otherwise instructed by your health care provider. °· Do not operate machinery or power tools for 24 hours after the procedure. °· If your procedure was done as an outpatient procedure, which means that you went home the same day as your procedure, a responsible adult should be with you for the first 24 hours after you arrive home. °· Keep all follow-up visits as directed by your health care provider. This is important. °Contact a health care provider if: °· You have a fever. °· You have chills. °· You have increased bleeding from the radial site. Hold pressure on the site. °Get help right away if: °· You have unusual pain at the radial site. °· You have redness, warmth, or swelling at the radial site. °· You have drainage (other than a small amount of blood on the dressing) from the radial site. °· The radial site is bleeding, and the bleeding does not stop after 30 minutes of holding steady pressure on the site. °· Your arm or hand becomes pale, cool, tingly, or numb. °This information is not intended to replace advice given to you by your health care provider. Make sure you discuss any questions you have with your health care provider. °Document Released: 01/26/2010 Document Revised: 06/01/2015 Document Reviewed: 07/12/2013 °Elsevier Interactive Patient Education © 2018 [employer] °Femoral Site Care °Refer to this sheet in the next few weeks. These instructions provide you with information about caring for yourself after your procedure. Your   health care provider may also give you more specific instructions. Your treatment has been planned according to current medical practices, but problems sometimes occur. Call your health care provider if you have any problems or questions after your procedure. °What can I expect after the procedure? °After your procedure, it is typical to have the following: °· Bruising at the site that usually fades within 1-2 weeks. °· Blood collecting in the tissue (hematoma) that  may be painful to the touch. It should usually decrease in size and tenderness within 1-2 weeks. ° °Follow these instructions at home: °· Take medicines only as directed by your health care provider. °· You may shower 24-48 hours after the procedure or as directed by your health care provider. Remove the bandage (dressing) and gently wash the site with plain soap and water. Pat the area dry with a clean towel. Do not rub the site, because this may cause bleeding. °· Do not take baths, swim, or use a hot tub until your health care provider approves. °· Check your insertion site every day for redness, swelling, or drainage. °· Do not apply powder or lotion to the site. °· Limit use of stairs to twice a day for the first 2-3 days or as directed by your health care provider. °· Do not squat for the first 2-3 days or as directed by your health care provider. °· Do not lift over 10 lb (4.5 kg) for 5 days after your procedure or as directed by your health care provider. °· Ask your health care provider when it is okay to: °? Return to work or school. °? Resume usual physical activities or sports. °? Resume sexual activity. °· Do not drive home if you are discharged the same day as the procedure. Have someone else drive you. °· You may drive 24 hours after the procedure unless otherwise instructed by your health care provider. °· Do not operate machinery or power tools for 24 hours after the procedure or as directed by your health care provider. °· If your procedure was done as an outpatient procedure, which means that you went home the same day as your procedure, a responsible adult should be with you for the first 24 hours after you arrive home. °· Keep all follow-up visits as directed by your health care provider. This is important. °Contact a health care provider if: °· You have a fever. °· You have chills. °· You have increased bleeding from the site. Hold pressure on the site. °Get help right away if: °· You have  unusual pain at the site. °· You have redness, warmth, or swelling at the site. °· You have drainage (other than a small amount of blood on the dressing) from the site. °· The site is bleeding, and the bleeding does not stop after 30 minutes of holding steady pressure on the site. °· Your leg or foot becomes pale, cool, tingly, or numb. °This information is not intended to replace advice given to you by your health care provider. Make sure you discuss any questions you have with your health care provider. °Document Released: 08/27/2013 Document Revised: 06/01/2015 Document Reviewed: 07/13/2013 °Elsevier Interactive Patient Education © 2018 Elsevier Inc. ° °

## 2017-03-18 NOTE — Progress Notes (Signed)
Client c/o right wrist pain and Vin,PA notified and in to see client; OK per Vin to d/c home

## 2017-03-18 NOTE — Interval H&P Note (Signed)
Cath Lab Visit (complete for each Cath Lab visit)  Clinical Evaluation Leading to the Procedure:   ACS: No.  Non-ACS:    Anginal Classification: CCS II  Anti-ischemic medical therapy: Minimal Therapy (1 class of medications)  Non-Invasive Test Results: No non-invasive testing performed  Prior CABG: No previous CABG  History and Physical Interval Note:  03/18/2017 10:58 AM  Angel MediciNancy H Bunten  has presented today for surgery, with the diagnosis of angina  The various methods of treatment have been discussed with the patient and family. After consideration of risks, benefits and other options for treatment, the patient has consented to  Procedure(s): LEFT HEART CATH AND CORONARY ANGIOGRAPHY (N/A) as a surgical intervention .  The patient's history has been reviewed, patient examined, no change in status, stable for surgery.  I have reviewed the patient's chart and labs.  Questions were answered to the patient's satisfaction.     Tonny BollmanMichael Martinez Boxx

## 2017-03-18 NOTE — Research (Signed)
OPTIMIZE Informed Consent   Subject Name: Angel Benton  Subject met inclusion and exclusion criteria.  The informed consent form, study requirements and expectations were reviewed with the subject and questions and concerns were addressed prior to the signing of the consent form.  The subject verbalized understanding of the trail requirements.  The subject agreed to participate in the OPTIMIZE trial and signed the informed consent.  The informed consent was obtained prior to performance of any protocol-specific procedures for the subject.  A copy of the signed informed consent was given to the subject and a copy was placed in the subject's medical record.  Hedrick,Tammy W 03/18/2017, 10:40

## 2017-03-18 NOTE — Progress Notes (Signed)
5 FR sheath removal from right Femoral artery.  10 min manual pressure held.  Vital documented in flowsheet.  Patient tolerated pressure well.  Groin site level 0 with palpable distal pulse.

## 2017-03-19 ENCOUNTER — Encounter (HOSPITAL_COMMUNITY): Payer: Self-pay | Admitting: Cardiovascular Disease

## 2017-03-19 MED FILL — Heparin Sodium (Porcine) 2 Unit/ML in Sodium Chloride 0.9%: INTRAMUSCULAR | Qty: 1000 | Status: AC

## 2017-03-24 ENCOUNTER — Telehealth: Payer: Self-pay | Admitting: *Deleted

## 2017-03-24 NOTE — Telephone Encounter (Signed)
Patient inquiring about her cath report.  She just happened to bring another patient to office today, but was asking questions regarding her cath.  Patient already has follow up scheduled with Dr. Koneswaran for 04/30/2017.   Angel Benton Per Dr. Purvis Benton - patient had a blockage in a very tiny artery which is too small for stenting.  Can start Ranexa 500mg  twice a day.

## 2017-04-04 DIAGNOSIS — E785 Hyperlipidemia, unspecified: Secondary | ICD-10-CM | POA: Diagnosis not present

## 2017-04-04 DIAGNOSIS — J101 Influenza due to other identified influenza virus with other respiratory manifestations: Secondary | ICD-10-CM | POA: Diagnosis not present

## 2017-04-04 DIAGNOSIS — Z299 Encounter for prophylactic measures, unspecified: Secondary | ICD-10-CM | POA: Diagnosis not present

## 2017-04-04 DIAGNOSIS — R509 Fever, unspecified: Secondary | ICD-10-CM | POA: Diagnosis not present

## 2017-04-04 DIAGNOSIS — Z789 Other specified health status: Secondary | ICD-10-CM | POA: Diagnosis not present

## 2017-04-04 DIAGNOSIS — Z6832 Body mass index (BMI) 32.0-32.9, adult: Secondary | ICD-10-CM | POA: Diagnosis not present

## 2017-04-04 NOTE — Telephone Encounter (Signed)
Left message to return call 

## 2017-04-09 NOTE — Telephone Encounter (Signed)
2nd attempt. Left message to return call

## 2017-04-10 MED ORDER — RANOLAZINE ER 500 MG PO TB12: 500.0000 mg | ORAL_TABLET | Freq: Two times a day (BID) | ORAL | 1 refills | Status: DC

## 2017-04-10 NOTE — Telephone Encounter (Signed)
Pt aware and voiced understanding - Ranexa sent to [facility] as requested

## 2017-04-14 ENCOUNTER — Other Ambulatory Visit: Payer: Self-pay | Admitting: *Deleted

## 2017-04-14 MED ORDER — RANOLAZINE ER 500 MG PO TB12: 500.0000 mg | ORAL_TABLET | Freq: Two times a day (BID) | ORAL | 3 refills | Status: DC

## 2017-04-30 ENCOUNTER — Encounter: Payer: Self-pay | Admitting: Cardiovascular Disease

## 2017-04-30 ENCOUNTER — Ambulatory Visit (INDEPENDENT_AMBULATORY_CARE_PROVIDER_SITE_OTHER): Payer: Medicare Other | Admitting: Cardiovascular Disease

## 2017-04-30 VITALS — BP 118/80 | HR 78 | Ht 63.0 in | Wt 186.0 lb

## 2017-04-30 DIAGNOSIS — I10 Essential (primary) hypertension: Secondary | ICD-10-CM

## 2017-04-30 DIAGNOSIS — I209 Angina pectoris, unspecified: Secondary | ICD-10-CM

## 2017-04-30 DIAGNOSIS — I25118 Atherosclerotic heart disease of native coronary artery with other forms of angina pectoris: Secondary | ICD-10-CM | POA: Diagnosis not present

## 2017-04-30 NOTE — Patient Instructions (Signed)

## 2017-04-30 NOTE — Progress Notes (Signed)
SUBJECTIVE: The patient presents for follow-up after undergoing coronary angiography on 03/18/2017 which showed severe stenosis of the right PDA which was located distally and a small vessel.  There was mild nonobstructive stenosis of the mid RCA and minimal irregularities of the LAD and left circumflex.  LVEDP was normal.  Medical therapy for chronic exertional angina was recommended as the PDA lesion was too small for percutaneous intervention.  I prescribed Ranexa but she was told it would cost $148 per month.  The patient denies any symptoms of chest pain, palpitations, shortness of breath, lightheadedness, dizziness, leg swelling, orthopnea, PND, and syncope.  She has not done any significant walking due to the inclement weather we have recently been experiencing.    Review of Systems: As per "subjective", otherwise negative.  Allergies  Allergen Reactions  . Penicillins Swelling    Has patient had a PCN reaction causing immediate rash, facial/tongue/throat swelling, SOB or lightheadedness with hypotension: No Has patient had a PCN reaction causing severe rash involving mucus membranes or skin necrosis: No Has patient had a PCN reaction that required hospitalization: No Has patient had a PCN reaction occurring within the last 10 years: No If all of the above answers are "NO", then may proceed with Cephalosporin use.     Current Outpatient Medications  Medication Sig Dispense Refill  . acetaminophen (TYLENOL) 500 MG tablet Take 1,000 mg by mouth every 8 (eight) hours as needed (for pain).    Marland Kitchen. amLODipine (NORVASC) 10 MG tablet Take 1 tablet (10 mg total) by mouth at bedtime. 30 tablet 6  . chlorthalidone (HYGROTON) 25 MG tablet Take 0.5 tablets (12.5 mg total) by mouth daily.    Marland Kitchen. escitalopram (LEXAPRO) 5 MG tablet Take 5 mg by mouth daily.    Marland Kitchen. ibuprofen (ADVIL,MOTRIN) 800 MG tablet Take 1 tablet (800 mg total) by mouth 3 (three) times daily. 21 tablet 0  . lisinopril  (PRINIVIL,ZESTRIL) 40 MG tablet Take 40 mg by mouth daily.    . nitroGLYCERIN (NITROSTAT) 0.4 MG SL tablet Place 1 tablet (0.4 mg total) under the tongue every 5 (five) minutes as needed for chest pain. 25 tablet 3  . omeprazole (PRILOSEC) 20 MG capsule Take 20-40 mg by mouth daily before breakfast.     No current facility-administered medications for this visit.     Past Medical History:  Diagnosis Date  . GERD (gastroesophageal reflux disease)   . Hypertension     Past Surgical History:  Procedure Laterality Date  . CHOLECYSTECTOMY    . COLONOSCOPY N/A 10/29/2013   Procedure: COLONOSCOPY;  Surgeon: Malissa HippoNajeeb U Rehman, MD;  [facility];  Service: Endoscopy;  Laterality: N/A;  730  . LEFT HEART CATH AND CORONARY ANGIOGRAPHY N/A 03/18/2017   Procedure: LEFT HEART CATH AND CORONARY ANGIOGRAPHY;  Surgeon: Tonny Bollmanooper, Michael, MD;  [facility];  Service: Cardiovascular;  Laterality: N/A;  . SHOULDER ARTHROSCOPY Left    2014 [place]  . SHOULDER ARTHROSCOPY Right 2013   cone surgery    Social History   Socioeconomic History  . Marital status: Married    Spouse name: Not on file  . Number of children: Not on file  . Years of education: Not on file  . Highest education level: Not on file  Occupational History  . Not on file  Social Needs  . Financial resource strain: Not on file  . Food insecurity:    Worry: Not on file  Inability: Not on file  . Transportation needs:    Medical: Not on file    Non-medical: Not on file  Tobacco Use  . Smoking status: Never Smoker  . Smokeless tobacco: Never Used  Substance and Sexual Activity  . Alcohol use: No  . Drug use: No  . Sexual activity: Not on file  Lifestyle  . Physical activity:    Days per week: Not on file    Minutes per session: Not on file  . Stress: Not on file  Relationships  . Social connections:    Talks on phone: Not on file    Gets together: Not on file    Attends religious  service: Not on file    Active member of club or organization: Not on file    Attends meetings of clubs or organizations: Not on file    Relationship status: Not on file  . Intimate partner violence:    Fear of current or ex partner: Not on file    Emotionally abused: Not on file    Physically abused: Not on file    Forced sexual activity: Not on file  Other Topics Concern  . Not on file  Social History Narrative  . Not on file     Vitals:   04/30/17 1359  BP: 118/80  Pulse: 78  SpO2: 98%  Weight: 186 lb (84.4 kg)  Height: 5\' 3"  (1.6 m)    Wt Readings from Last 3 Encounters:  04/30/17 186 lb (84.4 kg)  03/18/17 185 lb (83.9 kg)  03/14/17 186 lb (84.4 kg)     PHYSICAL EXAM General: NAD HEENT: Normal. Neck: No JVD, no thyromegaly. Lungs: Clear to auscultation bilaterally with normal respiratory effort. CV: Regular rate and rhythm, normal S1/S2, no S3/S4, no murmur. No pretibial or periankle edema.   Abdomen: Soft, nontender, no distention.  Neurologic: Alert and oriented.  Psych: Normal affect. Skin: Normal. Musculoskeletal: No gross deformities.    ECG: Most recent ECG reviewed.   Labs: Lab Results  Component Value Date/Time   K 3.9 03/14/2017 02:35 PM   BUN 21 (H) 03/14/2017 02:35 PM   CREATININE 0.78 03/14/2017 02:35 PM   CREATININE 0.76 11/18/2016 01:07 PM   ALT 16 05/11/2016 11:30 AM   HGB 13.9 03/14/2017 02:35 PM     Lipids: No results found for: LDLCALC, LDLDIRECT, CHOL, TRIG, HDL     ASSESSMENT AND PLAN: 1.  Coronary artery disease with angina: Coronary angiography details noted above with a severe stenosis and a distal small PDA not amenable to percutaneous intervention.  Symptomatically improved.  Should symptoms recur, I would consider Imdur 30 mg daily.  She is familiar with this medication as her husband also takes it.  2.  Hypertension: Blood pressure is normal.  No changes to therapy.    Disposition: Follow up 6 months   Prentice Docker, M.D., F.A.C.C.

## 2017-05-12 DIAGNOSIS — R300 Dysuria: Secondary | ICD-10-CM | POA: Diagnosis not present

## 2017-05-15 DIAGNOSIS — E2839 Other primary ovarian failure: Secondary | ICD-10-CM | POA: Diagnosis not present

## 2017-05-15 DIAGNOSIS — Z299 Encounter for prophylactic measures, unspecified: Secondary | ICD-10-CM | POA: Diagnosis not present

## 2017-05-15 DIAGNOSIS — Z1211 Encounter for screening for malignant neoplasm of colon: Secondary | ICD-10-CM | POA: Diagnosis not present

## 2017-05-15 DIAGNOSIS — Z1339 Encounter for screening examination for other mental health and behavioral disorders: Secondary | ICD-10-CM | POA: Diagnosis not present

## 2017-05-15 DIAGNOSIS — R5383 Other fatigue: Secondary | ICD-10-CM | POA: Diagnosis not present

## 2017-05-15 DIAGNOSIS — Z0000 Encounter for general adult medical examination without abnormal findings: Secondary | ICD-10-CM | POA: Diagnosis not present

## 2017-05-15 DIAGNOSIS — I10 Essential (primary) hypertension: Secondary | ICD-10-CM | POA: Diagnosis not present

## 2017-05-15 DIAGNOSIS — Z7189 Other specified counseling: Secondary | ICD-10-CM | POA: Diagnosis not present

## 2017-05-15 DIAGNOSIS — Z6832 Body mass index (BMI) 32.0-32.9, adult: Secondary | ICD-10-CM | POA: Diagnosis not present

## 2017-05-15 DIAGNOSIS — Z1331 Encounter for screening for depression: Secondary | ICD-10-CM | POA: Diagnosis not present

## 2017-05-15 DIAGNOSIS — Z79899 Other long term (current) drug therapy: Secondary | ICD-10-CM | POA: Diagnosis not present

## 2017-05-15 DIAGNOSIS — E7800 Pure hypercholesterolemia, unspecified: Secondary | ICD-10-CM | POA: Diagnosis not present

## 2017-06-12 ENCOUNTER — Telehealth: Payer: Self-pay | Admitting: *Deleted

## 2017-06-12 NOTE — Telephone Encounter (Signed)
Please return call.

## 2017-06-12 NOTE — Telephone Encounter (Signed)
Attempted to return call - no answer.   

## 2017-06-16 ENCOUNTER — Encounter: Payer: Self-pay | Admitting: *Deleted

## 2017-06-19 NOTE — Telephone Encounter (Signed)
Left message to return call 

## 2017-06-24 NOTE — Telephone Encounter (Signed)
Spoke with Fayrene FearingJames (husband) - she was not at home, but thought she was trying to get in touch with me for him.  Stated that he had some questions about coming off his Plavix, but that has already been taken care of.  He will have her call back if she needs anything further for herself.

## 2017-07-06 DIAGNOSIS — R350 Frequency of micturition: Secondary | ICD-10-CM | POA: Diagnosis not present

## 2017-07-06 DIAGNOSIS — R300 Dysuria: Secondary | ICD-10-CM | POA: Diagnosis not present

## 2017-09-10 DIAGNOSIS — Z6832 Body mass index (BMI) 32.0-32.9, adult: Secondary | ICD-10-CM | POA: Diagnosis not present

## 2017-09-10 DIAGNOSIS — R350 Frequency of micturition: Secondary | ICD-10-CM | POA: Diagnosis not present

## 2017-09-10 DIAGNOSIS — Z713 Dietary counseling and surveillance: Secondary | ICD-10-CM | POA: Diagnosis not present

## 2017-09-10 DIAGNOSIS — Z299 Encounter for prophylactic measures, unspecified: Secondary | ICD-10-CM | POA: Diagnosis not present

## 2017-09-10 DIAGNOSIS — I10 Essential (primary) hypertension: Secondary | ICD-10-CM | POA: Diagnosis not present

## 2017-09-25 DIAGNOSIS — E2839 Other primary ovarian failure: Secondary | ICD-10-CM | POA: Diagnosis not present

## 2017-10-03 DIAGNOSIS — Z23 Encounter for immunization: Secondary | ICD-10-CM | POA: Diagnosis not present

## 2017-11-05 ENCOUNTER — Ambulatory Visit (INDEPENDENT_AMBULATORY_CARE_PROVIDER_SITE_OTHER): Payer: Medicare Other | Admitting: Cardiovascular Disease

## 2017-11-05 ENCOUNTER — Encounter: Payer: Self-pay | Admitting: Cardiovascular Disease

## 2017-11-05 VITALS — BP 130/86 | HR 82 | Ht 60.0 in | Wt 191.6 lb

## 2017-11-05 DIAGNOSIS — R079 Chest pain, unspecified: Secondary | ICD-10-CM | POA: Diagnosis not present

## 2017-11-05 DIAGNOSIS — R0609 Other forms of dyspnea: Secondary | ICD-10-CM

## 2017-11-05 DIAGNOSIS — I10 Essential (primary) hypertension: Secondary | ICD-10-CM

## 2017-11-05 DIAGNOSIS — I25118 Atherosclerotic heart disease of native coronary artery with other forms of angina pectoris: Secondary | ICD-10-CM

## 2017-11-05 DIAGNOSIS — I209 Angina pectoris, unspecified: Secondary | ICD-10-CM

## 2017-11-05 MED ORDER — ISOSORBIDE MONONITRATE ER 30 MG PO TB24: 30.0000 mg | ORAL_TABLET | Freq: Every day | ORAL | 6 refills | Status: DC

## 2017-11-05 NOTE — Progress Notes (Signed)
SUBJECTIVE: The patient presents for follow-up of coronary artery disease.  Coronary angiography on 03/18/2017 showed severe stenosis of the right PDA which was located distally and a small vessel.  There was mild nonobstructive stenosis of the mid RCA and minimal irregularities of the LAD and left circumflex.  LVEDP was normal.  Medical therapy for chronic exertional angina was recommended as the PDA lesion was too small for percutaneous intervention.  I prescribed Ranexa but she was told it would cost $148 per month.  She denied anginal symptoms at her last visit.  Today she complains of exertional chest tightness and shortness of breath when trying to do outdoor work or trying to walk up an incline.  She has also been under a lot of stress lately as she is helping to take care of a sister who has stage III ovarian cancer.  She seldom has palpitations.  She has occasional lightheadedness but denies syncope.  She would like to start Imdur as we previously discussed this.    Review of Systems: As per "subjective", otherwise negative.  Allergies  Allergen Reactions  . Penicillins Swelling    Has patient had a PCN reaction causing immediate rash, facial/tongue/throat swelling, SOB or lightheadedness with hypotension: No Has patient had a PCN reaction causing severe rash involving mucus membranes or skin necrosis: No Has patient had a PCN reaction that required hospitalization: No Has patient had a PCN reaction occurring within the last 10 years: No If all of the above answers are "NO", then may proceed with Cephalosporin use.     Current Outpatient Medications  Medication Sig Dispense Refill  . acetaminophen (TYLENOL) 500 MG tablet Take 1,000 mg by mouth every 8 (eight) hours as needed (for pain).    Marland Kitchen amLODipine (NORVASC) 10 MG tablet Take 1 tablet (10 mg total) by mouth at bedtime. 30 tablet 6  . chlorthalidone (HYGROTON) 25 MG tablet Take 0.5 tablets (12.5 mg total) by  mouth daily.    Marland Kitchen escitalopram (LEXAPRO) 5 MG tablet Take 5 mg by mouth daily.    Marland Kitchen ibuprofen (ADVIL,MOTRIN) 800 MG tablet Take 1 tablet (800 mg total) by mouth 3 (three) times daily. 21 tablet 0  . lisinopril (PRINIVIL,ZESTRIL) 40 MG tablet Take 40 mg by mouth daily.    . nitroGLYCERIN (NITROSTAT) 0.4 MG SL tablet Place 1 tablet (0.4 mg total) under the tongue every 5 (five) minutes as needed for chest pain. 25 tablet 3  . omeprazole (PRILOSEC) 20 MG capsule Take 20-40 mg by mouth daily before breakfast.     No current facility-administered medications for this visit.     Past Medical History:  Diagnosis Date  . GERD (gastroesophageal reflux disease)   . Hypertension     Past Surgical History:  Procedure Laterality Date  . CHOLECYSTECTOMY    . COLONOSCOPY N/A 10/29/2013   Procedure: COLONOSCOPY;  Surgeon: Malissa Hippo, MD;  [facility];  Service: Endoscopy;  Laterality: N/A;  730  . LEFT HEART CATH AND CORONARY ANGIOGRAPHY N/A 03/18/2017   Procedure: LEFT HEART CATH AND CORONARY ANGIOGRAPHY;  Surgeon: Tonny Bollman, MD;  Location: [facility] INVASIVE CV LAB;  Service: Cardiovascular;  Laterality: N/A;  . SHOULDER ARTHROSCOPY Left    2014 [place]  . SHOULDER ARTHROSCOPY Right 2013   cone surgery    Social History   Socioeconomic History  . Marital status: Married    Spouse name: Not on file  . Number of children: Not on  file  . Years of education: Not on file  . Highest education level: Not on file  Occupational History  . Not on file  Social Needs  . Financial resource strain: Not on file  . Food insecurity:    Worry: Not on file    Inability: Not on file  . Transportation needs:    Medical: Not on file    Non-medical: Not on file  Tobacco Use  . Smoking status: Never Smoker  . Smokeless tobacco: Never Used  Substance and Sexual Activity  . Alcohol use: No  . Drug use: No  . Sexual activity: Not on file  Lifestyle  . Physical activity:     Days per week: Not on file    Minutes per session: Not on file  . Stress: Not on file  Relationships  . Social connections:    Talks on phone: Not on file    Gets together: Not on file    Attends religious service: Not on file    Active member of club or organization: Not on file    Attends meetings of clubs or organizations: Not on file    Relationship status: Not on file  . Intimate partner violence:    Fear of current or ex partner: Not on file    Emotionally abused: Not on file    Physically abused: Not on file    Forced sexual activity: Not on file  Other Topics Concern  . Not on file  Social History Narrative  . Not on file     Vitals:   11/05/17 1429  BP: 130/86  Pulse: 82  SpO2: 97%  Weight: 191 lb 9.6 oz (86.9 kg)  Height: 5' (1.524 m)    Wt Readings from Last 3 Encounters:  11/05/17 191 lb 9.6 oz (86.9 kg)  04/30/17 186 lb (84.4 kg)  03/18/17 185 lb (83.9 kg)     PHYSICAL EXAM General: NAD HEENT: Normal. Neck: No JVD, no thyromegaly. Lungs: Clear to auscultation bilaterally with normal respiratory effort. CV: Regular rate and rhythm, normal S1/S2, no S3/S4, no murmur. No pretibial or periankle edema.  No carotid bruit.   Abdomen: Soft, nontender, no distention.  Neurologic: Alert and oriented.  Psych: Normal affect. Skin: Normal. Musculoskeletal: No gross deformities.    ECG: Reviewed above under Subjective   Labs: Lab Results  Component Value Date/Time   K 3.9 03/14/2017 02:35 PM   BUN 21 (H) 03/14/2017 02:35 PM   CREATININE 0.78 03/14/2017 02:35 PM   CREATININE 0.76 11/18/2016 01:07 PM   ALT 16 05/11/2016 11:30 AM   HGB 13.9 03/14/2017 02:35 PM     Lipids: No results found for: LDLCALC, LDLDIRECT, CHOL, TRIG, HDL     ASSESSMENT AND PLAN:  1.  Coronary artery disease with angina: She is now experiencing exertional chest tightness and dyspnea consistent with angina.  Coronary angiography details noted above with a severe stenosis and  a distal small PDA not amenable to percutaneous intervention.    I will start Imdur 30 mg daily.  She is familiar with this medication as her husband also takes it.  She could not afford Ranexa.  2.  Hypertension: Blood pressure is normal.    I will monitor given the addition of Imdur.    Disposition: Follow up 3-4 months.   Prentice Docker, M.D., F.A.C.C.

## 2017-11-05 NOTE — Addendum Note (Signed)
Addended by: Lesle Chris on: 11/05/2017 02:59 PM   Modules accepted: Orders

## 2017-11-05 NOTE — Patient Instructions (Addendum)
Medication Instructions:   Begin Imdur 30mg  daily.   Continue all other current medications.  Labwork: none  Testing/Procedures: none  Follow-Up: 3-4 months   Any Other Special Instructions Will Be Listed Below (If Applicable).  If you need a refill on your cardiac medications before your next appointment, please call your pharmacy.

## 2017-12-11 DIAGNOSIS — J029 Acute pharyngitis, unspecified: Secondary | ICD-10-CM | POA: Diagnosis not present

## 2017-12-11 DIAGNOSIS — J069 Acute upper respiratory infection, unspecified: Secondary | ICD-10-CM | POA: Diagnosis not present

## 2017-12-14 DIAGNOSIS — J069 Acute upper respiratory infection, unspecified: Secondary | ICD-10-CM | POA: Diagnosis not present

## 2017-12-19 DIAGNOSIS — J329 Chronic sinusitis, unspecified: Secondary | ICD-10-CM | POA: Diagnosis not present

## 2017-12-19 DIAGNOSIS — Z6832 Body mass index (BMI) 32.0-32.9, adult: Secondary | ICD-10-CM | POA: Diagnosis not present

## 2017-12-19 DIAGNOSIS — Z299 Encounter for prophylactic measures, unspecified: Secondary | ICD-10-CM | POA: Diagnosis not present

## 2017-12-19 DIAGNOSIS — I10 Essential (primary) hypertension: Secondary | ICD-10-CM | POA: Diagnosis not present

## 2018-02-20 ENCOUNTER — Encounter: Payer: Self-pay | Admitting: Physician Assistant

## 2018-02-20 NOTE — Progress Notes (Deleted)
Cardiology Office Note    Date:  02/20/2018  ID:  Angel Benton, DOB 1950-03-29, MRN [id number] PCP:  Kirstie Peri, MD  Cardiologist:  Prentice Docker, MD   Chief Complaint: f/u angina  History of Present Illness:  Angel Benton is a 68 y.o. female with history of CAD, GERD, HTN, HLD who presents for f/u of angina.   Prior echo in 10/2016 showed EF 60-65%, mildly calcified aortic annulus. Coronary angiography on 03/18/2017 showed severe stenosis of the right PDA which was located distally and a small vessel.There was mild nonobstructive stenosis of the mid RCA and minimal irregularities of the LAD and left circumflex.LVEDP was normal. The PDA lesion was too small for PCI and medical therapy was recommended. Ranexa was too expensive. Imdur was started for angina/DOE in 10/2017. Last labs 03/2017 showed Hgb 13.9, Plt 359, K 3.9, BUN 21, Cr 0.78. Lipids in 2015 showed LDL 154, none on file since.  why no statin? CMET, Lipid profile, CBC nsaids   CAD Dyspnea on exertion HTN Hyperlipidemia   Past Medical History:  Diagnosis Date  . CAD (coronary artery disease)    a. Cath 03/2017 showed severe stenosis of the right PDA which was located distally and a small vessel.There was mild nonobstructive stenosis of the mid RCA and minimal irregularities of the LAD and left circumflex.LVEDP was normal. The PDA lesion was too small for PCI and medical therapy was recommended.  Marland Kitchen GERD (gastroesophageal reflux disease)   . Hyperlipidemia   . Hypertension     Past Surgical History:  Procedure Laterality Date  . CHOLECYSTECTOMY    . COLONOSCOPY N/A 10/29/2013   Procedure: COLONOSCOPY;  Surgeon: Malissa Hippo, MD;  Location: [facility];  Service: Endoscopy;  Laterality: N/A;  730  . LEFT HEART CATH AND CORONARY ANGIOGRAPHY N/A 03/18/2017   Procedure: LEFT HEART CATH AND CORONARY ANGIOGRAPHY;  Surgeon: Tonny Bollman, MD;  Location: [facility] INVASIVE CV LAB;  Service: Cardiovascular;   Laterality: N/A;  . SHOULDER ARTHROSCOPY Left    2014 [place]  . SHOULDER ARTHROSCOPY Right 2013   cone surgery    Current Medications: No outpatient medications have been marked as taking for the 02/23/18 encounter (Appointment) with Laurann Montana, PA-C.   ***   Allergies:   Penicillins   Social History   Socioeconomic History  . Marital status: Married    Spouse name: Not on file  . Number of children: Not on file  . Years of education: Not on file  . Highest education level: Not on file  Occupational History  . Not on file  Social Needs  . Financial resource strain: Not on file  . Food insecurity:    Worry: Not on file    Inability: Not on file  . Transportation needs:    Medical: Not on file    Non-medical: Not on file  Tobacco Use  . Smoking status: Never Smoker  . Smokeless tobacco: Never Used  Substance and Sexual Activity  . Alcohol use: No  . Drug use: No  . Sexual activity: Not on file  Lifestyle  . Physical activity:    Days per week: Not on file    Minutes per session: Not on file  . Stress: Not on file  Relationships  . Social connections:    Talks on phone: Not on file    Gets together: Not on file    Attends religious service: Not on file    Active member  of club or organization: Not on file    Attends meetings of clubs or organizations: Not on file    Relationship status: Not on file  Other Topics Concern  . Not on file  Social History Narrative  . Not on file     Family History:  The patient's ***family history is not on file.  ROS:   Please see the history of present illness. Otherwise, review of systems is positive for ***.  All other systems are reviewed and otherwise negative.    PHYSICAL EXAM:   VS:  There were no vitals taken for this visit.  BMI: There is no height or weight on file to calculate BMI. GEN: Well nourished, well developed, in no acute distress HEENT: normocephalic, atraumatic Neck: no JVD, carotid  bruits, or masses Cardiac: ***RRR; no murmurs, rubs, or gallops, no edema  Respiratory:  clear to auscultation bilaterally, normal work of breathing GI: soft, nontender, nondistended, + BS MS: no deformity or atrophy Skin: warm and dry, no rash Neuro:  Alert and Oriented x 3, Strength and sensation are intact, follows commands Psych: euthymic mood, full affect  Wt Readings from Last 3 Encounters:  11/05/17 191 lb 9.6 oz (86.9 kg)  04/30/17 186 lb (84.4 kg)  03/18/17 185 lb (83.9 kg)      Studies/Labs Reviewed:   EKG:  EKG was ordered today and personally reviewed by me and demonstrates *** EKG was not ordered today.***  Recent Labs: 03/14/2017: BUN 21; Creatinine, Ser 0.78; Hemoglobin 13.9; Platelets 359; Potassium 3.9; Sodium 141   Lipid Panel No results found for: CHOL, TRIG, HDL, CHOLHDL, VLDL, LDLCALC, LDLDIRECT  Additional studies/ records that were reviewed today include: Summarized above.***    ASSESSMENT & PLAN:   1. ***  Disposition: F/u with ***   Medication Adjustments/Labs and Tests Ordered: Current medicines are reviewed at length with the patient today.  Concerns regarding medicines are outlined above. Medication changes, Labs and Tests ordered today are summarized above and listed in the Patient Instructions accessible in Encounters.   Signed, Laurann Montana, PA-C  02/20/2018 1:23 PM    [facility] [place] [facility] [address] [place], [place], [place] [address] [phone]

## 2018-02-23 ENCOUNTER — Ambulatory Visit: Payer: Medicare Other | Admitting: Cardiovascular Disease

## 2018-02-23 ENCOUNTER — Ambulatory Visit: Payer: Medicare Other | Admitting: Physician Assistant

## 2018-03-26 ENCOUNTER — Telehealth: Payer: Self-pay | Admitting: Cardiovascular Disease

## 2018-03-26 NOTE — Telephone Encounter (Signed)
I called and spoke with the patient.  She is doing well from a cardiac perspective.  She is agreeable to having her appointment rescheduled given the COVID19 pandemic, in order to avoid unnecessary exposure.

## 2018-03-26 NOTE — Telephone Encounter (Signed)
Patient has been rescheduled to 05/15/2018 at 2:30 with Berton Bon, NP in [place] office.

## 2018-03-31 ENCOUNTER — Ambulatory Visit: Payer: Medicare Other | Admitting: Cardiovascular Disease

## 2018-05-04 ENCOUNTER — Other Ambulatory Visit: Payer: Self-pay | Admitting: Cardiovascular Disease

## 2018-05-04 ENCOUNTER — Telehealth: Payer: Self-pay

## 2018-05-04 MED ORDER — ISOSORBIDE MONONITRATE ER 30 MG PO TB24: 30.0000 mg | ORAL_TABLET | Freq: Every day | ORAL | 6 refills | Status: DC

## 2018-05-04 NOTE — Telephone Encounter (Signed)
°*  STAT* If patient is at the pharmacy, call can be transferred to refill team.   1. Which medications need to be refilled?  isosorbide mononitrate (IMDUR) 30 MG 24 hr tablet    2. Which pharmacy/location (including street and city if local pharmacy) is medication to be sent to? [facility], [place]  3. Do they need a 30 day or 90 day supply? 30 X 6

## 2018-05-04 NOTE — Telephone Encounter (Signed)
Done

## 2018-05-14 ENCOUNTER — Telehealth: Payer: Self-pay | Admitting: Cardiovascular Disease

## 2018-05-14 DIAGNOSIS — I10 Essential (primary) hypertension: Secondary | ICD-10-CM | POA: Diagnosis not present

## 2018-05-14 DIAGNOSIS — Z789 Other specified health status: Secondary | ICD-10-CM | POA: Diagnosis not present

## 2018-05-14 DIAGNOSIS — J449 Chronic obstructive pulmonary disease, unspecified: Secondary | ICD-10-CM | POA: Diagnosis not present

## 2018-05-14 DIAGNOSIS — E1122 Type 2 diabetes mellitus with diabetic chronic kidney disease: Secondary | ICD-10-CM | POA: Diagnosis not present

## 2018-05-14 DIAGNOSIS — E1165 Type 2 diabetes mellitus with hyperglycemia: Secondary | ICD-10-CM | POA: Diagnosis not present

## 2018-05-14 DIAGNOSIS — Z299 Encounter for prophylactic measures, unspecified: Secondary | ICD-10-CM | POA: Diagnosis not present

## 2018-05-14 DIAGNOSIS — Z6832 Body mass index (BMI) 32.0-32.9, adult: Secondary | ICD-10-CM | POA: Diagnosis not present

## 2018-05-14 NOTE — Telephone Encounter (Signed)
CONSENT OBTAINED ° ° °YOUR CARDIOLOGY TEAM HAS ARRANGED FOR AN E-VISIT FOR YOUR APPOINTMENT - PLEASE REVIEW IMPORTANT INFORMATION BELOW SEVERAL DAYS PRIOR TO YOUR APPOINTMENT ° °Due to the recent COVID-19 pandemic, we are transitioning in-person office visits to tele-medicine visits in an effort to decrease unnecessary exposure to our patients and staff. Medicare and most insurances are covering these visits without a copay needed. We also encourage you to sign up for MyChart if you have not already done so. You will need a smartphone if possible. For patients that do not have this, we can still complete the visit using a regular telephone but do prefer a smartphone to enable video when possible. You may have a close family member that lives with you that can help. If possible, we also ask that you have a blood pressure cuff and scale at home to measure your blood pressure, heart rate and weight prior to your scheduled appointment. Patients with clinical needs that need an in-person evaluation and testing will still be able to come to the office if absolutely necessary. If you have any questions, feel free to call our office. ° ° ° °IF YOU HAVE A SMARTPHONE, PLEASE DOWNLOAD THE WEBEX APP TO YOUR SMARTPHONE ° °- If Apple, go to App Store and type in WebEx in the search bar. Download [employer] Webex Meetings, the blue/green circle. The app is free but as with any other app download, your phone may require you to verify saved payment information or Apple password. You do NOT have to create a WebEx account. ° °- If Android, go to Google Play Store and type in WebEx in the search bar. Download [employer] Webex Meetings, the blue/green circle. The app is free but as with any other app download, your phone may require you to verify saved payment information or Android password. You do NOT have to create a WebEx account. ° °It is very helpful to have this downloaded before your visit. ° ° ° °2-3 DAYS BEFORE YOUR APPOINTMENT ° °You  will receive a telephone call from one of our [facility] team members - your caller ID may say "Unknown caller." If this is a video visit, we will confirm that you have been able to download the WebEx app. We will remind you check your blood pressure, heart rate and weight prior to your scheduled appointment. If you have an Apple Watch or Kardia, please upload any pertinent ECG strips the day before or morning of your appointment to MyChart. Our staff will also make sure you have reviewed the consent and agree to move forward with your scheduled tele-health visit.  ° ° ° °THE DAY OF YOUR APPOINTMENT ° °Approximately 15 minutes prior to your scheduled appointment, you will receive a telephone call from one of [facility] team - your caller ID may say "Unknown caller."  Our staff will confirm medications, vital signs for the day and any symptoms you may be experiencing. Please have this information available prior to the time of visit start. It may also be helpful for you to have a pad of paper and pen handy for any instructions given during your visit. They will also walk you through joining the WebEx smartphone meeting if this is a video visit. ° ° ° °CONSENT FOR TELE-HEALTH VISIT - PLEASE REVIEW ° °I hereby voluntarily request, consent and authorize [facility] and its employed or contracted physicians, physician assistants, nurse practitioners or other licensed health care professionals (the Practitioner), to provide me with telemedicine health care   services (the “Services") as deemed necessary by the treating Practitioner. I acknowledge and consent to receive the Services by the Practitioner via telemedicine. I understand that the telemedicine visit will involve communicating with the Practitioner through live audiovisual communication technology and the disclosure of certain medical information by electronic transmission. I acknowledge that I have been given the opportunity to request an in-person assessment or  other available alternative prior to the telemedicine visit and am voluntarily participating in the telemedicine visit. ° °I understand that I have the right to withhold or withdraw my consent to the use of telemedicine in the course of my care at any time, without affecting my right to future care or treatment, and that the Practitioner or I may terminate the telemedicine visit at any time. I understand that I have the right to inspect all information obtained and/or recorded in the course of the telemedicine visit and may receive copies of available information for a reasonable fee.  I understand that some of the potential risks of receiving the Services via telemedicine include:  °• Delay or interruption in medical evaluation due to technological equipment failure or disruption; °• Information transmitted may not be sufficient (e.g. poor resolution of images) to allow for appropriate medical decision making by the Practitioner; and/or  °• In rare instances, security protocols could fail, causing a breach of personal health information. ° °Furthermore, I acknowledge that it is my responsibility to provide information about my medical history, conditions and care that is complete and accurate to the best of my ability. I acknowledge that Practitioner's advice, recommendations, and/or decision may be based on factors not within their control, such as incomplete or inaccurate data provided by me or distortions of diagnostic images or specimens that may result from electronic transmissions. I understand that the practice of medicine is not an exact science and that Practitioner makes no warranties or guarantees regarding treatment outcomes. I acknowledge that I will receive a copy of this consent concurrently upon execution via email to the email address I last provided but may also request a printed copy by calling the office of [facility].   ° °I understand that my insurance will be billed for this visit.  ° °I  have read or had this consent read to me. °• I understand the contents of this consent, which adequately explains the benefits and risks of the Services being provided via telemedicine.  °• I have been provided ample opportunity to ask questions regarding this consent and the Services and have had my questions answered to my satisfaction. °• I give my informed consent for the services to be provided through the use of telemedicine in my medical care ° °By participating in this telemedicine visit I agree to the above. ° °

## 2018-05-15 ENCOUNTER — Ambulatory Visit: Payer: Medicare Other | Admitting: Cardiology

## 2018-05-15 ENCOUNTER — Encounter: Payer: Self-pay | Admitting: Cardiovascular Disease

## 2018-05-15 ENCOUNTER — Telehealth (INDEPENDENT_AMBULATORY_CARE_PROVIDER_SITE_OTHER): Payer: Medicare Other | Admitting: Cardiovascular Disease

## 2018-05-15 VITALS — BP 158/88 | HR 76 | Ht 63.0 in | Wt 184.0 lb

## 2018-05-15 DIAGNOSIS — I25118 Atherosclerotic heart disease of native coronary artery with other forms of angina pectoris: Secondary | ICD-10-CM

## 2018-05-15 DIAGNOSIS — I10 Essential (primary) hypertension: Secondary | ICD-10-CM

## 2018-05-15 MED ORDER — CHLORTHALIDONE 25 MG PO TABS: 12.5000 mg | ORAL_TABLET | Freq: Every day | ORAL | 3 refills | Status: DC

## 2018-05-15 NOTE — Addendum Note (Signed)
Addended by: Lesle Chris on: 05/15/2018 02:11 PM   Modules accepted: Orders

## 2018-05-15 NOTE — Progress Notes (Addendum)
Virtual Visit via Telephone Note   This visit type was conducted due to national recommendations for restrictions regarding the COVID-19 Pandemic (e.g. social distancing) in an effort to limit this patient's exposure and mitigate transmission in our community.  Due to her co-morbid illnesses, this patient is at least at moderate risk for complications without adequate follow up.  This format is felt to be most appropriate for this patient at this time.  The patient did not have access to video technology/had technical difficulties with video requiring transitioning to audio format only (telephone).  All issues noted in this document were discussed and addressed.  No physical exam could be performed with this format.  Please refer to the patient's chart for her  consent to telehealth for [facility].   Date:  05/15/2018   ID:  Angel Benton, DOB 06/03/1950, MRN [id number]  Patient Location: Home Provider Location: Home  PCP:  Kirstie Peri, MD  Cardiologist:  Prentice Docker, MD  Electrophysiologist:  None   Evaluation Performed:  Follow-Up Visit  Chief Complaint:  CAD  History of Present Illness:    Angel Benton is a 68 y.o. female with a h/o CAD.  Coronary angiography on 03/18/2017 showed severe stenosis of the right PDA which was located distally and a small vessel. There was mild nonobstructive stenosis of the mid RCA and minimal irregularities of the LAD and left circumflex. LVEDP was normal.  Medical therapy for chronic exertional angina was recommended as the PDA lesion was too small for percutaneous intervention.  I previously prescribed Ranexa but she was told it would cost $148 per month. I started Imdur at her last visit.  She hasn't been doing much walking lately. She says exertional dyspnea has significantly improved as has her chest pain.  She lost chlorthalidone. She's noticed some mild ankle swelling. Her BP is elevated today.  The patient does not have  symptoms concerning for COVID-19 infection (fever, chills, cough, or new shortness of breath).   Soc Hx: Her sister in law, Valinda Party (formerly my patient), passed away earlier this week.   Past Medical History:  Diagnosis Date  . CAD (coronary artery disease)    a. Cath 03/2017 showed severe stenosis of the right PDA which was located distally and a small vessel.There was mild nonobstructive stenosis of the mid RCA and minimal irregularities of the LAD and left circumflex.LVEDP was normal. The PDA lesion was too small for PCI and medical therapy was recommended.  Marland Kitchen GERD (gastroesophageal reflux disease)   . Hyperlipidemia   . Hypertension    Past Surgical History:  Procedure Laterality Date  . CHOLECYSTECTOMY    . COLONOSCOPY N/A 10/29/2013   Procedure: COLONOSCOPY;  Surgeon: Malissa Hippo, MD;  [facility];  Service: Endoscopy;  Laterality: N/A;  730  . LEFT HEART CATH AND CORONARY ANGIOGRAPHY N/A 03/18/2017   Procedure: LEFT HEART CATH AND CORONARY ANGIOGRAPHY;  Surgeon: Tonny Bollman, MD;  Location: [facility] INVASIVE CV LAB;  Service: Cardiovascular;  Laterality: N/A;  . SHOULDER ARTHROSCOPY Left    2014 [place]  . SHOULDER ARTHROSCOPY Right 2013   cone surgery     Current Meds  Medication Sig  . acetaminophen (TYLENOL) 500 MG tablet Take 1,000 mg by mouth every 8 (eight) hours as needed (for pain).  Marland Kitchen amLODipine (NORVASC) 10 MG tablet Take 1 tablet (10 mg total) by mouth at bedtime.  Marland Kitchen escitalopram (LEXAPRO) 5 MG tablet Take 5 mg by mouth daily.  Marland Kitchen  fexofenadine (ALLEGRA) 180 MG tablet Take 180 mg by mouth daily.  . isosorbide mononitrate (IMDUR) 30 MG 24 hr tablet Take 1 tablet (30 mg total) by mouth daily.  Marland Kitchen. lisinopril (PRINIVIL,ZESTRIL) 40 MG tablet Take 40 mg by mouth daily.  . nitroGLYCERIN (NITROSTAT) 0.4 MG SL tablet Place 1 tablet (0.4 mg total) under the tongue every 5 (five) minutes as needed for chest pain.  Marland Kitchen. omeprazole (PRILOSEC) 20  MG capsule Take 20-40 mg by mouth daily before breakfast.  . [DISCONTINUED] ibuprofen (ADVIL,MOTRIN) 800 MG tablet Take 1 tablet (800 mg total) by mouth 3 (three) times daily.     Allergies:   Penicillins   Social History   Tobacco Use  . Smoking status: Never Smoker  . Smokeless tobacco: Never Used  Substance Use Topics  . Alcohol use: No  . Drug use: No     Family Hx: The patient's family history is not on file.  ROS:   Please see the history of present illness.     All other systems reviewed and are negative.   Prior CV studies:   The following studies were reviewed today:  See above  Labs/Other Tests and Data Reviewed:    EKG:  No ECG reviewed.  Recent Labs: No results found for requested labs within last 8760 hours.   Recent Lipid Panel No results found for: CHOL, TRIG, HDL, CHOLHDL, LDLCALC, LDLDIRECT  Wt Readings from Last 3 Encounters:  05/15/18 184 lb (83.5 kg)  11/05/17 191 lb 9.6 oz (86.9 kg)  04/30/17 186 lb (84.4 kg)     Objective:    Vital Signs:  BP (!) 158/88   Pulse 76   Ht 5\' 3"  (1.6 m)   Wt 184 lb (83.5 kg)   BMI 32.59 kg/m    VITAL SIGNS:  reviewed  ASSESSMENT & PLAN:    1. Coronary artery disease with angina: Imdur started at last visit. Symptoms have significantly improved. Coronary angiography details noted above with a severe stenosis and a distal small PDA not amenable to percutaneous intervention. She could not afford Ranexa.  2. Hypertension: Blood pressure is elevated. She lost chlorthalidone. I will refill.      COVID-19 Education: The signs and symptoms of COVID-19 were discussed with the patient and how to seek care for testing (follow up with PCP or arrange E-visit).  The importance of social distancing was discussed today.  Time:   Today, I have spent 15 minutes with the patient with telehealth technology discussing the above problems.     Medication Adjustments/Labs and Tests Ordered: Current medicines are  reviewed at length with the patient today.  Concerns regarding medicines are outlined above.   Tests Ordered: No orders of the defined types were placed in this encounter.   Medication Changes: No orders of the defined types were placed in this encounter.   Disposition:  Follow up in 6 month(s)  Signed, Prentice DockerSuresh Felicia Both, MD  05/15/2018 1:56 PM    [facility]

## 2018-05-15 NOTE — Patient Instructions (Signed)
Medication Instructions:   Chlorthalidone refilled today.   Continue all other medications.    Labwork: none  Testing/Procedures: none  Follow-Up: Your physician wants you to follow up in: 6 months.  You will receive a reminder letter in the mail one-two months in advance.  If you don't receive a letter, please call our office to schedule the follow up appointment   Any Other Special Instructions Will Be Listed Below (If Applicable).  If you need a refill on your cardiac medications before your next appointment, please call your pharmacy.

## 2018-05-21 DIAGNOSIS — Z1331 Encounter for screening for depression: Secondary | ICD-10-CM | POA: Diagnosis not present

## 2018-05-21 DIAGNOSIS — Z1211 Encounter for screening for malignant neoplasm of colon: Secondary | ICD-10-CM | POA: Diagnosis not present

## 2018-05-21 DIAGNOSIS — Z01419 Encounter for gynecological examination (general) (routine) without abnormal findings: Secondary | ICD-10-CM | POA: Diagnosis not present

## 2018-05-21 DIAGNOSIS — R5383 Other fatigue: Secondary | ICD-10-CM | POA: Diagnosis not present

## 2018-05-21 DIAGNOSIS — Z79899 Other long term (current) drug therapy: Secondary | ICD-10-CM | POA: Diagnosis not present

## 2018-05-21 DIAGNOSIS — E7800 Pure hypercholesterolemia, unspecified: Secondary | ICD-10-CM | POA: Diagnosis not present

## 2018-05-21 DIAGNOSIS — Z7189 Other specified counseling: Secondary | ICD-10-CM | POA: Diagnosis not present

## 2018-05-21 DIAGNOSIS — I10 Essential (primary) hypertension: Secondary | ICD-10-CM | POA: Diagnosis not present

## 2018-05-21 DIAGNOSIS — Z299 Encounter for prophylactic measures, unspecified: Secondary | ICD-10-CM | POA: Diagnosis not present

## 2018-05-21 DIAGNOSIS — Z1339 Encounter for screening examination for other mental health and behavioral disorders: Secondary | ICD-10-CM | POA: Diagnosis not present

## 2018-05-21 DIAGNOSIS — Z0000 Encounter for general adult medical examination without abnormal findings: Secondary | ICD-10-CM | POA: Diagnosis not present

## 2018-05-21 DIAGNOSIS — Z6832 Body mass index (BMI) 32.0-32.9, adult: Secondary | ICD-10-CM | POA: Diagnosis not present

## 2018-06-02 DIAGNOSIS — I259 Chronic ischemic heart disease, unspecified: Secondary | ICD-10-CM | POA: Diagnosis not present

## 2018-06-02 DIAGNOSIS — Z299 Encounter for prophylactic measures, unspecified: Secondary | ICD-10-CM | POA: Diagnosis not present

## 2018-06-02 DIAGNOSIS — R1013 Epigastric pain: Secondary | ICD-10-CM | POA: Diagnosis not present

## 2018-06-02 DIAGNOSIS — I10 Essential (primary) hypertension: Secondary | ICD-10-CM | POA: Diagnosis not present

## 2018-06-02 DIAGNOSIS — Z6832 Body mass index (BMI) 32.0-32.9, adult: Secondary | ICD-10-CM | POA: Diagnosis not present

## 2018-06-29 DIAGNOSIS — R1013 Epigastric pain: Secondary | ICD-10-CM | POA: Diagnosis not present

## 2018-07-28 DIAGNOSIS — Z1159 Encounter for screening for other viral diseases: Secondary | ICD-10-CM | POA: Diagnosis not present

## 2018-07-30 DIAGNOSIS — Z880 Allergy status to penicillin: Secondary | ICD-10-CM | POA: Diagnosis not present

## 2018-07-30 DIAGNOSIS — K219 Gastro-esophageal reflux disease without esophagitis: Secondary | ICD-10-CM | POA: Diagnosis not present

## 2018-07-30 DIAGNOSIS — K449 Diaphragmatic hernia without obstruction or gangrene: Secondary | ICD-10-CM | POA: Diagnosis not present

## 2018-07-30 DIAGNOSIS — K2950 Unspecified chronic gastritis without bleeding: Secondary | ICD-10-CM | POA: Diagnosis not present

## 2018-07-30 DIAGNOSIS — Z87442 Personal history of urinary calculi: Secondary | ICD-10-CM | POA: Diagnosis not present

## 2018-07-30 DIAGNOSIS — F419 Anxiety disorder, unspecified: Secondary | ICD-10-CM | POA: Diagnosis not present

## 2018-07-30 DIAGNOSIS — I10 Essential (primary) hypertension: Secondary | ICD-10-CM | POA: Diagnosis not present

## 2018-07-30 DIAGNOSIS — R1013 Epigastric pain: Secondary | ICD-10-CM | POA: Diagnosis not present

## 2018-07-30 DIAGNOSIS — I209 Angina pectoris, unspecified: Secondary | ICD-10-CM | POA: Diagnosis not present

## 2018-07-30 DIAGNOSIS — I2510 Atherosclerotic heart disease of native coronary artery without angina pectoris: Secondary | ICD-10-CM | POA: Diagnosis not present

## 2018-07-30 DIAGNOSIS — M1990 Unspecified osteoarthritis, unspecified site: Secondary | ICD-10-CM | POA: Diagnosis not present

## 2018-08-25 DIAGNOSIS — Z1231 Encounter for screening mammogram for malignant neoplasm of breast: Secondary | ICD-10-CM | POA: Diagnosis not present

## 2018-10-02 DIAGNOSIS — Z23 Encounter for immunization: Secondary | ICD-10-CM | POA: Diagnosis not present

## 2018-10-26 DIAGNOSIS — R590 Localized enlarged lymph nodes: Secondary | ICD-10-CM | POA: Diagnosis not present

## 2018-10-26 DIAGNOSIS — Z87891 Personal history of nicotine dependence: Secondary | ICD-10-CM | POA: Diagnosis not present

## 2018-10-26 DIAGNOSIS — I10 Essential (primary) hypertension: Secondary | ICD-10-CM | POA: Diagnosis not present

## 2018-10-26 DIAGNOSIS — Z299 Encounter for prophylactic measures, unspecified: Secondary | ICD-10-CM | POA: Diagnosis not present

## 2018-10-27 DIAGNOSIS — R590 Localized enlarged lymph nodes: Secondary | ICD-10-CM | POA: Diagnosis not present

## 2018-11-03 ENCOUNTER — Encounter (INDEPENDENT_AMBULATORY_CARE_PROVIDER_SITE_OTHER): Payer: Self-pay | Admitting: *Deleted

## 2019-01-14 DIAGNOSIS — Z299 Encounter for prophylactic measures, unspecified: Secondary | ICD-10-CM | POA: Diagnosis not present

## 2019-01-14 DIAGNOSIS — J329 Chronic sinusitis, unspecified: Secondary | ICD-10-CM | POA: Diagnosis not present

## 2019-01-14 DIAGNOSIS — Z1152 Encounter for screening for COVID-19: Secondary | ICD-10-CM | POA: Diagnosis not present

## 2019-01-14 DIAGNOSIS — Z87891 Personal history of nicotine dependence: Secondary | ICD-10-CM | POA: Diagnosis not present

## 2019-01-28 DIAGNOSIS — R21 Rash and other nonspecific skin eruption: Secondary | ICD-10-CM | POA: Diagnosis not present

## 2019-01-28 DIAGNOSIS — N760 Acute vaginitis: Secondary | ICD-10-CM | POA: Diagnosis not present

## 2019-02-08 DIAGNOSIS — N760 Acute vaginitis: Secondary | ICD-10-CM | POA: Diagnosis not present

## 2019-02-08 DIAGNOSIS — R21 Rash and other nonspecific skin eruption: Secondary | ICD-10-CM | POA: Diagnosis not present

## 2019-02-11 DIAGNOSIS — Z23 Encounter for immunization: Secondary | ICD-10-CM | POA: Diagnosis not present

## 2019-03-01 DIAGNOSIS — H6122 Impacted cerumen, left ear: Secondary | ICD-10-CM | POA: Diagnosis not present

## 2019-03-01 DIAGNOSIS — Z6833 Body mass index (BMI) 33.0-33.9, adult: Secondary | ICD-10-CM | POA: Diagnosis not present

## 2019-03-01 DIAGNOSIS — Z87891 Personal history of nicotine dependence: Secondary | ICD-10-CM | POA: Diagnosis not present

## 2019-03-01 DIAGNOSIS — E669 Obesity, unspecified: Secondary | ICD-10-CM | POA: Diagnosis not present

## 2019-03-01 DIAGNOSIS — R590 Localized enlarged lymph nodes: Secondary | ICD-10-CM | POA: Diagnosis not present

## 2019-03-01 DIAGNOSIS — Z299 Encounter for prophylactic measures, unspecified: Secondary | ICD-10-CM | POA: Diagnosis not present

## 2019-03-01 DIAGNOSIS — I10 Essential (primary) hypertension: Secondary | ICD-10-CM | POA: Diagnosis not present

## 2019-03-10 DIAGNOSIS — Z299 Encounter for prophylactic measures, unspecified: Secondary | ICD-10-CM | POA: Diagnosis not present

## 2019-03-10 DIAGNOSIS — Z6832 Body mass index (BMI) 32.0-32.9, adult: Secondary | ICD-10-CM | POA: Diagnosis not present

## 2019-03-10 DIAGNOSIS — I10 Essential (primary) hypertension: Secondary | ICD-10-CM | POA: Diagnosis not present

## 2019-03-10 DIAGNOSIS — R591 Generalized enlarged lymph nodes: Secondary | ICD-10-CM | POA: Diagnosis not present

## 2019-03-10 DIAGNOSIS — J329 Chronic sinusitis, unspecified: Secondary | ICD-10-CM | POA: Diagnosis not present

## 2019-03-12 DIAGNOSIS — Z23 Encounter for immunization: Secondary | ICD-10-CM | POA: Diagnosis not present

## 2019-03-17 DIAGNOSIS — M542 Cervicalgia: Secondary | ICD-10-CM | POA: Diagnosis not present

## 2019-03-17 DIAGNOSIS — E049 Nontoxic goiter, unspecified: Secondary | ICD-10-CM | POA: Diagnosis not present

## 2019-03-17 DIAGNOSIS — R591 Generalized enlarged lymph nodes: Secondary | ICD-10-CM | POA: Diagnosis not present

## 2019-03-30 DIAGNOSIS — E049 Nontoxic goiter, unspecified: Secondary | ICD-10-CM | POA: Diagnosis not present

## 2019-03-30 DIAGNOSIS — E042 Nontoxic multinodular goiter: Secondary | ICD-10-CM | POA: Diagnosis not present

## 2019-03-30 DIAGNOSIS — I10 Essential (primary) hypertension: Secondary | ICD-10-CM | POA: Diagnosis not present

## 2019-03-30 DIAGNOSIS — Z299 Encounter for prophylactic measures, unspecified: Secondary | ICD-10-CM | POA: Diagnosis not present

## 2019-04-21 ENCOUNTER — Ambulatory Visit (INDEPENDENT_AMBULATORY_CARE_PROVIDER_SITE_OTHER): Payer: Medicare Other | Admitting: Orthopaedic Surgery

## 2019-04-21 ENCOUNTER — Other Ambulatory Visit: Payer: Self-pay

## 2019-04-21 ENCOUNTER — Encounter: Payer: Self-pay | Admitting: Orthopaedic Surgery

## 2019-04-21 VITALS — BP 186/106 | HR 62 | Ht 63.0 in | Wt 191.0 lb

## 2019-04-21 DIAGNOSIS — M65311 Trigger thumb, right thumb: Secondary | ICD-10-CM | POA: Diagnosis not present

## 2019-04-21 NOTE — Progress Notes (Signed)
Subjective:    Patient ID: Angel Benton, female    DOB: 02-15-1950, 69 y.o.   MRN: [id number]  HPI She has had locking of the right dominant thumb over the last few months getting worse.  Some days it "sticks" more than others.  She has no trauma, no redness.  It hurts when it locks.  Some days she has minimal problem but it is locking more and more.  She wants something done for it.  She has no other finger problems.  No other joint problems.  Review of Systems  Constitutional: Positive for activity change.  Musculoskeletal: Positive for arthralgias.  All other systems reviewed and are negative.  For Review of Systems, all other systems reviewed and are negative.  The following is a summary of the past history medically, past history surgically, known current medicines, social history and family history.  This information is gathered electronically by the computer from prior information and documentation.  I review this each visit and have found including this information at this point in the chart is beneficial and informative.   Past Medical History:  Diagnosis Date  . CAD (coronary artery disease)    a. Cath 03/2017 showed severe stenosis of the right PDA which was located distally and a small vessel.There was mild nonobstructive stenosis of the mid RCA and minimal irregularities of the LAD and left circumflex.LVEDP was normal. The PDA lesion was too small for PCI and medical therapy was recommended.  Marland Kitchen GERD (gastroesophageal reflux disease)   . Hyperlipidemia   . Hypertension     Past Surgical History:  Procedure Laterality Date  . CHOLECYSTECTOMY    . COLONOSCOPY N/A 10/29/2013   Procedure: COLONOSCOPY;  Surgeon: Rogene Houston, MD;  [facility];  Service: Endoscopy;  Laterality: N/A;  730  . LEFT HEART CATH AND CORONARY ANGIOGRAPHY N/A 03/18/2017   Procedure: LEFT HEART CATH AND CORONARY ANGIOGRAPHY;  Surgeon: Sherren Mocha, MD;  Location: [facility];   Service: Cardiovascular;  Laterality: N/A;  . SHOULDER ARTHROSCOPY Left    2014 [place]  . SHOULDER ARTHROSCOPY Right 2013   cone surgery    Current Outpatient Medications on File Prior to Visit  Medication Sig Dispense Refill  . acetaminophen (TYLENOL) 500 MG tablet Take 1,000 mg by mouth every 8 (eight) hours as needed (for pain).    Marland Kitchen amLODipine (NORVASC) 10 MG tablet Take 1 tablet (10 mg total) by mouth at bedtime. 30 tablet 6  . chlorthalidone (HYGROTON) 25 MG tablet Take 0.5 tablets (12.5 mg total) by mouth daily. 45 tablet 3  . escitalopram (LEXAPRO) 5 MG tablet Take 5 mg by mouth daily.    . fexofenadine (ALLEGRA) 180 MG tablet Take 180 mg by mouth daily.    . isosorbide mononitrate (IMDUR) 30 MG 24 hr tablet Take 1 tablet (30 mg total) by mouth daily. 30 tablet 6  . lisinopril (PRINIVIL,ZESTRIL) 40 MG tablet Take 40 mg by mouth daily.    . nitroGLYCERIN (NITROSTAT) 0.4 MG SL tablet Place 1 tablet (0.4 mg total) under the tongue every 5 (five) minutes as needed for chest pain. 25 tablet 3  . omeprazole (PRILOSEC) 20 MG capsule Take 20-40 mg by mouth daily before breakfast.     No current facility-administered medications on file prior to visit.    Social History   Socioeconomic History  . Marital status: Married    Spouse name: Not on file  . Number of children: Not  on file  . Years of education: Not on file  . Highest education level: Not on file  Occupational History  . Not on file  Tobacco Use  . Smoking status: Never Smoker  . Smokeless tobacco: Never Used  Substance and Sexual Activity  . Alcohol use: No  . Drug use: No  . Sexual activity: Not on file  Other Topics Concern  . Not on file  Social History Narrative  . Not on file   Social Determinants of Health   Financial Resource Strain:   . Difficulty of Paying Living Expenses:   Food Insecurity:   . Worried About Programme researcher, broadcasting/film/video in the Last Year:   . Barista in the Last Year:     Transportation Needs:   . Freight forwarder (Medical):   Marland Kitchen Lack of Transportation (Non-Medical):   Physical Activity:   . Days of Exercise per Week:   . Minutes of Exercise per Session:   Stress:   . Feeling of Stress :   Social Connections:   . Frequency of Communication with Friends and Family:   . Frequency of Social Gatherings with Friends and Family:   . Attends Religious Services:   . Active Member of Clubs or Organizations:   . Attends Banker Meetings:   Marland Kitchen Marital Status:   Intimate Partner Violence:   . Fear of Current or Ex-Partner:   . Emotionally Abused:   Marland Kitchen Physically Abused:   . Sexually Abused:     History reviewed. No pertinent family history.  BP (!) 186/106   Pulse 62   Ht 5\' 3"  (1.6 m)   Wt 191 lb (86.6 kg)   BMI 33.83 kg/m   Body mass index is 33.83 kg/m.     Objective:   Physical Exam Vitals and nursing note reviewed.  Constitutional:      Appearance: She is well-developed.  HENT:     Head: Normocephalic and atraumatic.  Eyes:     Conjunctiva/sclera: Conjunctivae normal.     Pupils: Pupils are equal, round, and reactive to light.  Cardiovascular:     Rate and Rhythm: Normal rate and regular rhythm.  Pulmonary:     Effort: Pulmonary effort is normal.  Abdominal:     Palpations: Abdomen is soft.  Musculoskeletal:       Hands:     Cervical back: Normal range of motion and neck supple.  Skin:    General: Skin is warm and dry.  Neurological:     Mental Status: She is alert and oriented to person, place, and time.     Cranial Nerves: No cranial nerve deficit.     Motor: No abnormal muscle tone.     Coordination: Coordination normal.     Deep Tendon Reflexes: Reflexes are normal and symmetric. Reflexes normal.  Psychiatric:        Behavior: Behavior normal.        Thought Content: Thought content normal.        Judgment: Judgment normal.           Assessment & Plan:   Encounter Diagnosis  Name Primary?  .  Trigger thumb, right thumb Yes   I have explained what a trigger thumb is.  I have told her the definitive treatment is surgical release.  I have offered injection but she wants to just have it "fixed" and not worry about it any more.  I will have her see Dr. for surgery  evaluation.  She is agreeable to this.  Call if any problem.  Precautions discussed.   Electronically Signed Darreld Mclean, MD 4/14/202110:38 AM

## 2019-04-23 ENCOUNTER — Other Ambulatory Visit: Payer: Self-pay

## 2019-04-23 ENCOUNTER — Encounter: Payer: Self-pay | Admitting: Orthopedic Surgery

## 2019-04-23 ENCOUNTER — Ambulatory Visit (INDEPENDENT_AMBULATORY_CARE_PROVIDER_SITE_OTHER): Payer: Medicare Other | Admitting: Orthopedic Surgery

## 2019-04-23 VITALS — BP 147/91 | HR 75 | Ht 63.0 in | Wt 189.0 lb

## 2019-04-23 DIAGNOSIS — M65311 Trigger thumb, right thumb: Secondary | ICD-10-CM

## 2019-04-23 NOTE — Progress Notes (Signed)
Chief Complaint  Patient presents with  . Hand Problem    right trigger thumb     69 year old female referred to me by Dr. Hilda Lias patient has any triggering phenomenon right thumb does not want an injection.  She complains of pain over the A1 pulley right thumb with clicking and popping  Review of systems denies numbness or tingling denies any chest pain.  She does have heart disease and took Nitrostat last time was months ago.  She did run of run out of her Imdur and will talk to her doctor about restarting it before surgery  Past Medical History:  Diagnosis Date  . CAD (coronary artery disease)    a. Cath 03/2017 showed severe stenosis of the right PDA which was located distally and a small vessel.There was mild nonobstructive stenosis of the mid RCA and minimal irregularities of the LAD and left circumflex.LVEDP was normal. The PDA lesion was too small for PCI and medical therapy was recommended.  Marland Kitchen GERD (gastroesophageal reflux disease)   . Hyperlipidemia   . Hypertension    Past Surgical History:  Procedure Laterality Date  . CHOLECYSTECTOMY    . COLONOSCOPY N/A 10/29/2013   Procedure: COLONOSCOPY;  Surgeon: Malissa Hippo, MD;  [facility];  Service: Endoscopy;  Laterality: N/A;  730  . LEFT HEART CATH AND CORONARY ANGIOGRAPHY N/A 03/18/2017   Procedure: LEFT HEART CATH AND CORONARY ANGIOGRAPHY;  Surgeon: Tonny Bollman, MD;  Location: [facility] INVASIVE CV LAB;  Service: Cardiovascular;  Laterality: N/A;  . SHOULDER ARTHROSCOPY Left    2014 [place]  . SHOULDER ARTHROSCOPY Right 2013   cone surgery   History reviewed. No pertinent family history. Social History   Tobacco Use  . Smoking status: Never Smoker  . Smokeless tobacco: Never Used  Substance Use Topics  . Alcohol use: No  . Drug use: No    Current Outpatient Medications:  .  acetaminophen (TYLENOL) 500 MG tablet, Take 1,000 mg by mouth every 8 (eight) hours as needed (for pain)., Disp: ,  Rfl:  .  amLODipine (NORVASC) 10 MG tablet, Take 1 tablet (10 mg total) by mouth at bedtime., Disp: 30 tablet, Rfl: 6 .  chlorthalidone (HYGROTON) 25 MG tablet, Take 0.5 tablets (12.5 mg total) by mouth daily., Disp: 45 tablet, Rfl: 3 .  escitalopram (LEXAPRO) 5 MG tablet, Take 5 mg by mouth daily., Disp: , Rfl:  .  fexofenadine (ALLEGRA) 180 MG tablet, Take 180 mg by mouth daily., Disp: , Rfl:  .  lisinopril (PRINIVIL,ZESTRIL) 40 MG tablet, Take 40 mg by mouth daily., Disp: , Rfl:  .  nitroGLYCERIN (NITROSTAT) 0.4 MG SL tablet, Place 1 tablet (0.4 mg total) under the tongue every 5 (five) minutes as needed for chest pain., Disp: 25 tablet, Rfl: 3 .  omeprazole (PRILOSEC) 20 MG capsule, Take 20-40 mg by mouth daily before breakfast., Disp: , Rfl:  .  isosorbide mononitrate (IMDUR) 30 MG 24 hr tablet, Take 1 tablet (30 mg total) by mouth daily. (Patient not taking: Reported on 04/23/2019), Disp: 30 tablet, Rfl: 6  BP (!) 147/91   Pulse 75   Ht 5\' 3"  (1.6 m)   Wt 189 lb (85.7 kg)   BMI 33.48 kg/m   Normal appearance Oriented x3 Mood pleasant affect normal Gait normal Right thumb is tender and swollen over the A1 pulley she has normal range of motion without locking but there is clicking and popping tendon function is intact joint is stable skin  is normal pulse and color are good capillary refill is normal lymph nodes are negative sensation is intact  Impression  Encounter Diagnosis  Name Primary?  . Trigger thumb of right hand Yes    The procedure has been fully reviewed with the patient; The risks and benefits of surgery have been discussed and explained and understood. Alternative treatment has also been reviewed, questions were encouraged and answered. The postoperative plan is also been reviewed.  Release A1 pulley right thumb

## 2019-04-23 NOTE — Patient Instructions (Signed)
Surgery right thumb on April 27

## 2019-04-27 NOTE — Patient Instructions (Signed)
Angel Benton  04/27/2019     @PREFPERIOPPHARMACY @   Your procedure is scheduled on  05/04/2019 .  Report to [name] at  0815 A.M.  Call this number if you have problems the morning of surgery:  [phone]   Remember:  Do not eat or drink after midnight.                       Take these medicines the morning of surgery with A SIP OF WATER  Lexapro, isosorbide, prilosec.    Do not wear jewelry, make-up or nail polish.  Do not wear lotions, powders, or perfumes, or deodorant. Please brush your teeth.  Do not shave 48 hours prior to surgery.  Men may shave face and neck.  Do not bring valuables to the hospital.  [facility] any belongings or valuables.  Contacts, dentures or bridgework may not be worn into surgery.  Leave your suitcase in the car.  After surgery it may be brought to your room.  For patients admitted to the hospital, discharge time will be determined by your treatment team.  Patients discharged the day of surgery will not be allowed to drive home.   Name and phone number of your driver:   family Special instructions:  DO NOT smoke the morning of your procedure.  Please read over the following fact sheets that you were given. Anesthesia Post-op Instructions and Care and Recovery After Surgery       Incision Care, Adult An incision is a cut that a doctor makes in your skin for surgery (for a procedure). Most times, these cuts are closed after surgery. Your cut from surgery may be closed with stitches (sutures), staples, skin glue, or skin tape (adhesive strips). You may need to return to your doctor to have stitches or staples taken out. This may happen many days or many weeks after your surgery. The cut needs to be well cared for so it does not get infected. How to care for your cut Cut care   Follow instructions from your doctor about how to take care of your cut. Make sure you: ? Wash your hands with soap and  water before you change your bandage (dressing). If you cannot use soap and water, use hand sanitizer. ? Change your bandage as told by your doctor. ? Leave stitches, skin glue, or skin tape in place. They may need to stay in place for 2 weeks or longer. If tape strips get loose and curl up, you may trim the loose edges. Do not remove tape strips completely unless your doctor says it is okay.  Check your cut area every day for signs of infection. Check for: ? More redness, swelling, or pain. ? More fluid or blood. ? Warmth. ? Pus or a bad smell.  Ask your doctor how to clean the cut. This may include: ? Using mild soap and water. ? Using a clean towel to pat the cut dry after you clean it. ? Putting a cream or ointment on the cut. Do this only as told by your doctor. ? Covering the cut with a clean bandage.  Ask your doctor when you can leave the cut uncovered.  Do not take baths, swim, or use a hot tub until your doctor says it is okay. Ask your doctor if you can take showers. You may only be allowed to take sponge baths  for bathing. Medicines  If you were prescribed an antibiotic medicine, cream, or ointment, take the antibiotic or put it on the cut as told by your doctor. Do not stop taking or putting on the antibiotic even if your condition gets better.  Take over-the-counter and prescription medicines only as told by your doctor. General instructions  Limit movement around your cut. This helps healing. ? Avoid straining, lifting, or exercise for the first month, or for as long as told by your doctor. ? Follow instructions from your doctor about going back to your normal activities. ? Ask your doctor what activities are safe.  Protect your cut from the sun when you are outside for the first 6 months, or for as long as told by your doctor. Put on sunscreen around the scar or cover up the scar.  Keep all follow-up visits as told by your doctor. This is important. Contact a doctor  if:  Your have more redness, swelling, or pain around the cut.  You have more fluid or blood coming from the cut.  Your cut feels warm to the touch.  You have pus or a bad smell coming from the cut.  You have a fever or shaking chills.  You feel sick to your stomach (nauseous) or you throw up (vomit).  You are dizzy.  Your stitches or staples come undone. Get help right away if:  You have a red streak coming from your cut.  Your cut bleeds through the bandage and the bleeding does not stop with gentle pressure.  The edges of your cut open up and separate.  You have very bad (severe) pain.  You have a rash.  You are confused.  You pass out (faint).  You have trouble breathing and you have a fast heartbeat. This information is not intended to replace advice given to you by your health care provider. Make sure you discuss any questions you have with your health care provider. Document Revised: 05/13/2016 Document Reviewed: 09/01/2015 Elsevier Patient Education  [employer].  Trigger Finger  Trigger finger, also called stenosing tenosynovitis,  is a condition that causes a finger to get stuck in a bent position. Each finger has a tendon, which is a tough, cord-like tissue that connects muscle to bone, and each tendon passes through a tunnel of tissue called a tendon sheath. To move your finger, your tendon needs to glide freely through the sheath. Trigger finger happens when the tendon or the sheath thickens, making it difficult to move your finger. Trigger finger can affect any finger or a thumb. It may affect more than one finger. Mild cases may clear up with rest and medicine. Severe cases require more treatment. What are the causes? Trigger finger is caused by a thickened finger tendon or tendon sheath. The cause of this thickening is not known. What increases the risk? The following factors may make you more likely to develop this condition:  Doing activities  that require a strong grip.  Having rheumatoid arthritis, gout, or diabetes.  Being 67-43 years old.  Being female. What are the signs or symptoms? Symptoms of this condition include:  Pain when bending or straightening your finger.  Tenderness or swelling where your finger attaches to the palm of your hand.  A lump in the palm of your hand or on the inside of your finger.  Hearing a noise like a pop or a snap when you try to straighten your finger.  Feeling a catching or locking sensation  when you try to straighten your finger.  Being unable to straighten your finger. How is this diagnosed? This condition is diagnosed based on your symptoms and a physical exam. How is this treated? This condition may be treated by:  Resting your finger and avoiding activities that make symptoms worse.  Wearing a finger splint to keep your finger extended.  Taking NSAIDs, such as ibuprofen, to relieve pain and swelling.  Doing gentle exercises to stretch the finger as told by your health care provider.  Having medicine that reduces swelling and inflammation (steroids) injected into the tendon sheath. Injections may need to be repeated.  Having surgery to open the tendon sheath. This may be done if other treatments do not work and you cannot straighten your finger. You may need physical therapy after surgery. Follow these instructions at home: If you have a splint:  Wear the splint as told by your health care provider. Remove it only as told by your health care provider.  Loosen it if your fingers tingle, become numb, or turn cold and blue.  Keep it clean.  If the splint is not waterproof: ? Do not let it get wet. ? Cover it with a watertight covering when you take a bath or shower. Managing pain, stiffness, and swelling     If directed, apply heat to the affected area as often as told by your health care provider. Use the heat source that your health care provider recommends, such  as a moist heat pack or a heating pad.  Place a towel between your skin and the heat source.  Leave the heat on for 20-30 minutes.  Remove the heat if your skin turns bright red. This is especially important if you are unable to feel pain, heat, or cold. You may have a greater risk of getting burned. If directed, put ice on the painful area. To do this:  If you have a removable splint, remove it as told by your health care provider.  Put ice in a plastic bag.  Place a towel between your skin and the bag or between your splint and the bag.  Leave the ice on for 20 minutes, 2-3 times a day.  Activity  Rest your finger as told by your health care provider. Avoid activities that make the pain worse.  Return to your normal activities as told by your health care provider. Ask your health care provider what activities are safe for you.  Do exercises as told by your health care provider.  Ask your health care provider when it is safe to drive if you have a splint on your hand. General instructions  Take over-the-counter and prescription medicines only as told by your health care provider.  Keep all follow-up visits as told by your health care provider. This is important. Contact a health care provider if:  Your symptoms are not improving with home care. Summary  Trigger finger, also called stenosing tenosynovitis, causes your finger to get stuck in a bent position. This can make it difficult and painful to straighten your finger.  This condition develops when a finger tendon or tendon sheath thickens.  Treatment may include resting your finger, wearing a splint, and taking medicines.  In severe cases, surgery to open the tendon sheath may be needed. This information is not intended to replace advice given to you by your health care provider. Make sure you discuss any questions you have with your health care provider. Document Revised: 05/11/2018 Document Reviewed:  05/11/2018  Elsevier Patient Education  2020 Else[employer] Inc.  Monitored Anesthesia Care, Care After These instructions provide you with information about caring for yourself after your procedure. Your health care provider may also give you more specific instructions. Your treatment has been planned according to current medical practices, but problems sometimes occur. Call your health care provider if you have any problems or questions after your procedure. What can I expect after the procedure? After your procedure, you may:  Feel sleepy for several hours.  Feel clumsy and have poor balance for several hours.  Feel forgetful about what happened after the procedure.  Have poor judgment for several hours.  Feel nauseous or vomit.  Have a sore throat if you had a breathing tube during the procedure. Follow these instructions at home: For at least 24 hours after the procedure:      Have a responsible adult stay with you. It is important to have someone help care for you until you are awake and alert.  Rest as needed.  Do not: ? Participate in activities in which you could fall or become injured. ? Drive. ? Use heavy machinery. ? Drink alcohol. ? Take sleeping pills or medicines that cause drowsiness. ? Make important decisions or sign legal documents. ? Take care of children on your own. Eating and drinking  Follow the diet that is recommended by your health care provider.  If you vomit, drink water, juice, or soup when you can drink without vomiting.  Make sure you have little or no nausea before eating solid foods. General instructions  Take over-the-counter and prescription medicines only as told by your health care provider.  If you have sleep apnea, surgery and certain medicines can increase your risk for breathing problems. Follow instructions from your health care provider about wearing your sleep device: ? Anytime you are sleeping, including during daytime  naps. ? While taking prescription pain medicines, sleeping medicines, or medicines that make you drowsy.  If you smoke, do not smoke without supervision.  Keep all follow-up visits as told by your health care provider. This is important. Contact a health care provider if:  You keep feeling nauseous or you keep vomiting.  You feel light-headed.  You develop a rash.  You have a fever. Get help right away if:  You have trouble breathing. Summary  For several hours after your procedure, you may feel sleepy and have poor judgment.  Have a responsible adult stay with you for at least 24 hours or until you are awake and alert. This information is not intended to replace advice given to you by your health care provider. Make sure you discuss any questions you have with your health care provider. Document Revised: 03/24/2017 Document Reviewed: 04/16/2015 Elsevier Patient Education  2020 [employer]. How to Use Chlorhexidine for Bathing Chlorhexidine gluconate (CHG) is a germ-killing (antiseptic) solution that is used to clean the skin. It can get rid of the bacteria that normally live on the skin and can keep them away for about 24 hours. To clean your skin with CHG, you may be given:  A CHG solution to use in the shower or as part of a sponge bath.  A prepackaged cloth that contains CHG. Cleaning your skin with CHG may help lower the risk for infection:  While you are staying in the intensive care unit of the hospital.  If you have a vascular access, such as a central line, to provide short-term or long-term access to your veins.  If  you have a catheter to drain urine from your bladder.  If you are on a ventilator. A ventilator is a machine that helps you breathe by moving air in and out of your lungs.  After surgery. What are the risks? Risks of using CHG include:  A skin reaction.  Hearing loss, if CHG gets in your ears.  Eye injury, if CHG gets in your eyes and is not  rinsed out.  The CHG product catching fire. Make sure that you avoid smoking and flames after applying CHG to your skin. Do not use CHG:  If you have a chlorhexidine allergy or have previously reacted to chlorhexidine.  On babies younger than 75 months of age. How to use CHG solution  Use CHG only as told by your health care provider, and follow the instructions on the label.  Use the full amount of CHG as directed. Usually, this is one bottle. During a shower Follow these steps when using CHG solution during a shower (unless your health care provider gives you different instructions): 1. Start the shower. 2. Use your normal soap and shampoo to wash your face and hair. 3. Turn off the shower or move out of the shower stream. 4. Pour the CHG onto a clean washcloth. Do not use any type of brush or rough-edged sponge. 5. Starting at your neck, lather your body down to your toes. Make sure you follow these instructions: ? If you will be having surgery, pay special attention to the part of your body where you will be having surgery. Scrub this area for at least 1 minute. ? Do not use CHG on your head or face. If the solution gets into your ears or eyes, rinse them well with water. ? Avoid your genital area. ? Avoid any areas of skin that have broken skin, cuts, or scrapes. ? Scrub your back and under your arms. Make sure to wash skin folds. 6. Let the lather sit on your skin for 1-2 minutes or as long as told by your health care provider. 7. Thoroughly rinse your entire body in the shower. Make sure that all body creases and crevices are rinsed well. 8. Dry off with a clean towel. Do not put any substances on your body afterward--such as powder, lotion, or perfume--unless you are told to do so by your health care provider. Only use lotions that are recommended by the manufacturer. 9. Put on clean clothes or pajamas. 10. If it is the night before your surgery, sleep in clean sheets.  During  a sponge bath Follow these steps when using CHG solution during a sponge bath (unless your health care provider gives you different instructions): 1. Use your normal soap and shampoo to wash your face and hair. 2. Pour the CHG onto a clean washcloth. 3. Starting at your neck, lather your body down to your toes. Make sure you follow these instructions: ? If you will be having surgery, pay special attention to the part of your body where you will be having surgery. Scrub this area for at least 1 minute. ? Do not use CHG on your head or face. If the solution gets into your ears or eyes, rinse them well with water. ? Avoid your genital area. ? Avoid any areas of skin that have broken skin, cuts, or scrapes. ? Scrub your back and under your arms. Make sure to wash skin folds. 4. Let the lather sit on your skin for 1-2 minutes or as long as told  by your health care provider. 5. Using a different clean, wet washcloth, thoroughly rinse your entire body. Make sure that all body creases and crevices are rinsed well. 6. Dry off with a clean towel. Do not put any substances on your body afterward--such as powder, lotion, or perfume--unless you are told to do so by your health care provider. Only use lotions that are recommended by the manufacturer. 7. Put on clean clothes or pajamas. 8. If it is the night before your surgery, sleep in clean sheets. How to use CHG prepackaged cloths  Only use CHG cloths as told by your health care provider, and follow the instructions on the label.  Use the CHG cloth on clean, dry skin.  Do not use the CHG cloth on your head or face unless your health care provider tells you to.  When washing with the CHG cloth: ? Avoid your genital area. ? Avoid any areas of skin that have broken skin, cuts, or scrapes. Before surgery Follow these steps when using a CHG cloth to clean before surgery (unless your health care provider gives you different instructions): 1. Using the CHG  cloth, vigorously scrub the part of your body where you will be having surgery. Scrub using a back-and-forth motion for 3 minutes. The area on your body should be completely wet with CHG when you are done scrubbing. 2. Do not rinse. Discard the cloth and let the area air-dry. Do not put any substances on the area afterward, such as powder, lotion, or perfume. 3. Put on clean clothes or pajamas. 4. If it is the night before your surgery, sleep in clean sheets.  For general bathing Follow these steps when using CHG cloths for general bathing (unless your health care provider gives you different instructions). 1. Use a separate CHG cloth for each area of your body. Make sure you wash between any folds of skin and between your fingers and toes. Wash your body in the following order, switching to a new cloth after each step: ? The front of your neck, shoulders, and chest. ? Both of your arms, under your arms, and your hands. ? Your stomach and groin area, avoiding the genitals. ? Your right leg and foot. ? Your left leg and foot. ? The back of your neck, your back, and your buttocks. 2. Do not rinse. Discard the cloth and let the area air-dry. Do not put any substances on your body afterward--such as powder, lotion, or perfume--unless you are told to do so by your health care provider. Only use lotions that are recommended by the manufacturer. 3. Put on clean clothes or pajamas. Contact a health care provider if:  Your skin gets irritated after scrubbing.  You have questions about using your solution or cloth. Get help right away if:  Your eyes become very red or swollen.  Your eyes itch badly.  Your skin itches badly and is red or swollen.  Your hearing changes.  You have trouble seeing.  You have swelling or tingling in your mouth or throat.  You have trouble breathing.  You swallow any chlorhexidine. Summary  Chlorhexidine gluconate (CHG) is a germ-killing (antiseptic) solution  that is used to clean the skin. Cleaning your skin with CHG may help to lower your risk for infection.  You may be given CHG to use for bathing. It may be in a bottle or in a prepackaged cloth to use on your skin. Carefully follow your health care provider's instructions and the instructions on  the product label.  Do not use CHG if you have a chlorhexidine allergy.  Contact your health care provider if your skin gets irritated after scrubbing. This information is not intended to replace advice given to you by your health care provider. Make sure you discuss any questions you have with your health care provider. Document Revised: 03/12/2018 Document Reviewed: 11/21/2016 Elsevier Patient Education  2020 [employer].

## 2019-04-28 ENCOUNTER — Encounter (HOSPITAL_COMMUNITY)
Admission: RE | Admit: 2019-04-28 | Discharge: 2019-04-28 | Disposition: A | Discharge status: Home / Self Care | Payer: Medicare Other | Source: Ambulatory Visit | Attending: Orthopedic Surgery | Admitting: Orthopedic Surgery

## 2019-04-28 ENCOUNTER — Other Ambulatory Visit: Payer: Self-pay

## 2019-04-28 ENCOUNTER — Encounter (HOSPITAL_COMMUNITY): Payer: Self-pay

## 2019-04-28 DIAGNOSIS — Z01812 Encounter for preprocedural laboratory examination: Secondary | ICD-10-CM | POA: Insufficient documentation

## 2019-04-28 HISTORY — DX: Unspecified osteoarthritis, unspecified site: M19.90

## 2019-04-28 LAB — BASIC METABOLIC PANEL
Anion gap: 9 (ref 5–15)
BUN: 17 mg/dL (ref 8–23)
CO2: 23 mmol/L (ref 22–32)
Calcium: 8.8 mg/dL — ABNORMAL LOW (ref 8.9–10.3)
Chloride: 105 mmol/L (ref 98–111)
Creatinine, Ser: 0.74 mg/dL (ref 0.44–1.00)
GFR calc Af Amer: >60 mL/min (ref >60)
GFR calc non Af Amer: >60 mL/min (ref >60)
Glucose, Bld: 115 mg/dL — ABNORMAL HIGH (ref 70–99)
Potassium: 3.6 mmol/L (ref 3.5–5.1)
Sodium: 137 mmol/L (ref 135–145)

## 2019-04-28 LAB — CBC WITH DIFFERENTIAL/PLATELET
Abs Immature Granulocytes: 0.01 10*3/uL (ref 0.00–0.07)
Basophils Absolute: 0.1 10*3/uL (ref 0.0–0.1)
Basophils Relative: 1 %
Eosinophils Absolute: 0.2 10*3/uL (ref 0.0–0.5)
Eosinophils Relative: 3 %
HCT: 38 % (ref 36.0–46.0)
Hemoglobin: 12.6 g/dL (ref 12.0–15.0)
Immature Granulocytes: 0 %
Lymphocytes Relative: 35 %
Lymphs Abs: 3.1 10*3/uL (ref 0.7–4.0)
MCH: 31.7 pg (ref 26.0–34.0)
MCHC: 33.2 g/dL (ref 30.0–36.0)
MCV: 95.7 fL (ref 80.0–100.0)
Monocytes Absolute: 0.7 10*3/uL (ref 0.1–1.0)
Monocytes Relative: 7 %
Neutro Abs: 4.9 10*3/uL (ref 1.7–7.7)
Neutrophils Relative %: 54 %
Platelets: 335 10*3/uL (ref 150–400)
RBC: 3.97 MIL/uL (ref 3.87–5.11)
RDW: 12.3 % (ref 11.5–15.5)
WBC: 8.9 10*3/uL (ref 4.0–10.5)
nRBC: 0 % (ref 0.0–0.2)

## 2019-04-30 ENCOUNTER — Other Ambulatory Visit: Payer: Self-pay

## 2019-04-30 ENCOUNTER — Other Ambulatory Visit (HOSPITAL_COMMUNITY)
Admission: RE | Admit: 2019-04-30 | Discharge: 2019-04-30 | Disposition: A | Discharge status: Home / Self Care | Payer: Medicare Other | Source: Ambulatory Visit | Attending: Orthopedic Surgery | Admitting: Orthopedic Surgery

## 2019-04-30 DIAGNOSIS — Z01812 Encounter for preprocedural laboratory examination: Secondary | ICD-10-CM | POA: Diagnosis not present

## 2019-04-30 DIAGNOSIS — Z20822 Contact with and (suspected) exposure to covid-19: Secondary | ICD-10-CM | POA: Diagnosis not present

## 2019-05-01 LAB — SARS CORONAVIRUS 2 (TAT 6-24 HRS): SARS Coronavirus 2: NEGATIVE

## 2019-05-04 ENCOUNTER — Encounter (HOSPITAL_COMMUNITY)
Admission: RE | Disposition: A | Discharge status: Home / Self Care | Payer: Self-pay | Source: Home / Self Care | Attending: Orthopedic Surgery

## 2019-05-04 ENCOUNTER — Encounter (HOSPITAL_COMMUNITY): Payer: Self-pay | Admitting: Orthopedic Surgery

## 2019-05-04 ENCOUNTER — Ambulatory Visit (HOSPITAL_COMMUNITY): Payer: Medicare Other | Admitting: Anesthesiology

## 2019-05-04 ENCOUNTER — Ambulatory Visit (HOSPITAL_COMMUNITY)
Admission: RE | Admit: 2019-05-04 | Discharge: 2019-05-04 | Disposition: A | Discharge status: Home / Self Care | Payer: Medicare Other | Attending: Orthopedic Surgery | Admitting: Orthopedic Surgery

## 2019-05-04 DIAGNOSIS — E785 Hyperlipidemia, unspecified: Secondary | ICD-10-CM | POA: Insufficient documentation

## 2019-05-04 DIAGNOSIS — M65311 Trigger thumb, right thumb: Secondary | ICD-10-CM | POA: Diagnosis not present

## 2019-05-04 DIAGNOSIS — I10 Essential (primary) hypertension: Secondary | ICD-10-CM | POA: Insufficient documentation

## 2019-05-04 DIAGNOSIS — Z79899 Other long term (current) drug therapy: Secondary | ICD-10-CM | POA: Insufficient documentation

## 2019-05-04 DIAGNOSIS — K219 Gastro-esophageal reflux disease without esophagitis: Secondary | ICD-10-CM | POA: Insufficient documentation

## 2019-05-04 DIAGNOSIS — I2510 Atherosclerotic heart disease of native coronary artery without angina pectoris: Secondary | ICD-10-CM | POA: Insufficient documentation

## 2019-05-04 HISTORY — PX: TRIGGER FINGER RELEASE: SHX641

## 2019-05-04 SURGERY — RELEASE, A1 PULLEY, FOR TRIGGER FINGER
Anesthesia: Monitor Anesthesia Care | Site: Thumb | Laterality: Right

## 2019-05-04 MED ORDER — MIDAZOLAM HCL 2 MG/2ML IJ SOLN
INTRAMUSCULAR | Status: AC
  Filled 2019-05-04: qty 2

## 2019-05-04 MED ORDER — ONDANSETRON HCL 4 MG/2ML IJ SOLN
INTRAMUSCULAR | Status: AC
  Filled 2019-05-04: qty 2

## 2019-05-04 MED ORDER — ACETAMINOPHEN-CODEINE #3 300-30 MG PO TABS: 1.0000 | ORAL_TABLET | Freq: Four times a day (QID) | ORAL | 0 refills | Status: AC | PRN

## 2019-05-04 MED ORDER — VANCOMYCIN HCL IN DEXTROSE 1-5 GM/200ML-% IV SOLN
1000.0000 mg | INTRAVENOUS | Status: AC
  Administered 2019-05-04: 1000 mg via INTRAVENOUS

## 2019-05-04 MED ORDER — LIDOCAINE HCL (PF) 0.5 % IJ SOLN
INTRAMUSCULAR | Status: DC | PRN
Start: 2019-05-04 — End: 2019-05-04
  Administered 2019-05-04: 50 mL

## 2019-05-04 MED ORDER — FENTANYL CITRATE (PF) 100 MCG/2ML IJ SOLN
INTRAMUSCULAR | Status: DC | PRN
  Administered 2019-05-04: 100 ug via INTRAVENOUS

## 2019-05-04 MED ORDER — LIDOCAINE 2% (20 MG/ML) 5 ML SYRINGE
INTRAMUSCULAR | Status: AC
  Filled 2019-05-04: qty 5

## 2019-05-04 MED ORDER — FENTANYL CITRATE (PF) 100 MCG/2ML IJ SOLN
INTRAMUSCULAR | Status: AC
  Filled 2019-05-04: qty 2

## 2019-05-04 MED ORDER — HYDROMORPHONE HCL 1 MG/ML IJ SOLN: 0.2500 mg | INTRAMUSCULAR | Status: DC | PRN

## 2019-05-04 MED ORDER — PROPOFOL 10 MG/ML IV BOLUS
INTRAVENOUS | Status: DC | PRN
Start: 2019-05-04 — End: 2019-05-04
  Administered 2019-05-04 (×4): 10 mg via INTRAVENOUS

## 2019-05-04 MED ORDER — LACTATED RINGERS IV SOLN: Freq: Once | INTRAVENOUS | Status: AC

## 2019-05-04 MED ORDER — BUPIVACAINE HCL (PF) 0.5 % IJ SOLN
INTRAMUSCULAR | Status: AC
  Filled 2019-05-04: qty 30

## 2019-05-04 MED ORDER — LIDOCAINE HCL (PF) 1 % IJ SOLN
INTRAMUSCULAR | Status: AC
  Filled 2019-05-04: qty 30

## 2019-05-04 MED ORDER — MEPERIDINE HCL 50 MG/ML IJ SOLN: 6.2500 mg | INTRAMUSCULAR | Status: DC | PRN

## 2019-05-04 MED ORDER — MIDAZOLAM HCL 2 MG/2ML IJ SOLN
INTRAMUSCULAR | Status: DC | PRN
Start: 2019-05-04 — End: 2019-05-04
  Administered 2019-05-04: 2 mg via INTRAVENOUS

## 2019-05-04 MED ORDER — LACTATED RINGERS IV SOLN: INTRAVENOUS | Status: DC | PRN

## 2019-05-04 MED ORDER — SODIUM CHLORIDE 0.9 % IR SOLN
Status: DC | PRN
  Administered 2019-05-04: 1000 mL

## 2019-05-04 MED ORDER — VANCOMYCIN HCL IN DEXTROSE 1-5 GM/200ML-% IV SOLN
INTRAVENOUS | Status: AC
  Filled 2019-05-04: qty 200

## 2019-05-04 MED ORDER — LIDOCAINE HCL (PF) 0.5 % IJ SOLN
INTRAMUSCULAR | Status: AC
  Filled 2019-05-04: qty 50

## 2019-05-04 MED ORDER — ONDANSETRON HCL 4 MG/2ML IJ SOLN
4.0000 mg | Freq: Once | INTRAMUSCULAR | Status: AC | PRN
  Administered 2019-05-04: 4 mg via INTRAVENOUS

## 2019-05-04 MED ORDER — BUPIVACAINE HCL (PF) 0.5 % IJ SOLN
INTRAMUSCULAR | Status: DC | PRN
  Administered 2019-05-04: 10 mL

## 2019-05-04 SURGICAL SUPPLY — 44 items
APL PRP STRL LF DISP 70% ISPRP (MISCELLANEOUS) ×1
BANDAGE ELASTIC 3 LF NS (GAUZE/BANDAGES/DRESSINGS) ×2 IMPLANT
BANDAGE ESMARK 4X12 BL STRL LF (DISPOSABLE) ×1 IMPLANT
BLADE SURG 15 STRL LF DISP TIS (BLADE) ×1 IMPLANT
BLADE SURG 15 STRL SS (BLADE) ×3
BNDG CMPR 12X4 ELC STRL LF (DISPOSABLE) ×1
BNDG CMPR MED 5X3 ELC HKLP NS (GAUZE/BANDAGES/DRESSINGS) ×1
BNDG COHESIVE 3X5 TAN STRL LF (GAUZE/BANDAGES/DRESSINGS) ×3 IMPLANT
BNDG CONFORM 2 STRL LF (GAUZE/BANDAGES/DRESSINGS) ×3 IMPLANT
BNDG ELASTIC 2X5.8 VLCR STR LF (GAUZE/BANDAGES/DRESSINGS) ×3 IMPLANT
BNDG ESMARK 4X12 BLUE STRL LF (DISPOSABLE) ×3
CHLORAPREP W/TINT 26 (MISCELLANEOUS) ×3 IMPLANT
CLOTH BEACON ORANGE TIMEOUT ST (SAFETY) ×3 IMPLANT
COVER LIGHT HANDLE STERIS (MISCELLANEOUS) ×6 IMPLANT
COVER WAND RF STERILE (DRAPES) ×6 IMPLANT
CUFF TOURN SGL QUICK 18X4 (TOURNIQUET CUFF) ×3 IMPLANT
DECANTER SPIKE VIAL GLASS SM (MISCELLANEOUS) ×3 IMPLANT
DRAPE HALF SHEET 40X57 (DRAPES) ×3 IMPLANT
DRSG XEROFORM 1X8 (GAUZE/BANDAGES/DRESSINGS) ×2 IMPLANT
ELECT NDL TIP 2.8 STRL (NEEDLE) ×1 IMPLANT
ELECT NEEDLE TIP 2.8 STRL (NEEDLE) ×3 IMPLANT
ELECT REM PT RETURN 9FT ADLT (ELECTROSURGICAL) ×3
ELECTRODE REM PT RTRN 9FT ADLT (ELECTROSURGICAL) ×1 IMPLANT
GAUZE SPONGE 4X4 12PLY STRL (GAUZE/BANDAGES/DRESSINGS) ×3 IMPLANT
GAUZE XEROFORM 1X8 LF (GAUZE/BANDAGES/DRESSINGS) ×3 IMPLANT
GLOVE BIOGEL PI IND STRL 7.0 (GLOVE) ×2 IMPLANT
GLOVE BIOGEL PI INDICATOR 7.0 (GLOVE) ×4
GLOVE ECLIPSE 6.5 STRL STRAW (GLOVE) ×2 IMPLANT
GLOVE SKINSENSE NS SZ8.0 LF (GLOVE) ×2
GLOVE SKINSENSE STRL SZ8.0 LF (GLOVE) ×1 IMPLANT
GLOVE SS N UNI LF 8.5 STRL (GLOVE) ×3 IMPLANT
GOWN STRL REUS W/TWL LRG LVL3 (GOWN DISPOSABLE) ×3 IMPLANT
GOWN STRL REUS W/TWL XL LVL3 (GOWN DISPOSABLE) ×3 IMPLANT
KIT TURNOVER KIT A (KITS) ×3 IMPLANT
MANIFOLD NEPTUNE II (INSTRUMENTS) ×3 IMPLANT
NDL HYPO 21X1.5 SAFETY (NEEDLE) ×1 IMPLANT
NEEDLE HYPO 21X1.5 SAFETY (NEEDLE) ×3 IMPLANT
NS IRRIG 1000ML POUR BTL (IV SOLUTION) ×3 IMPLANT
PACK BASIC LIMB (CUSTOM PROCEDURE TRAY) ×2 IMPLANT
PAD ARMBOARD 7.5X6 YLW CONV (MISCELLANEOUS) ×3 IMPLANT
POSITIONER HAND ALUMI XLG (MISCELLANEOUS) ×3 IMPLANT
SET BASIN LINEN APH (SET/KITS/TRAYS/PACK) ×3 IMPLANT
SUT ETHILON 3 0 FSL (SUTURE) ×3 IMPLANT
SYR CONTROL 10ML LL (SYRINGE) ×3 IMPLANT

## 2019-05-04 NOTE — Anesthesia Postprocedure Evaluation (Signed)
Anesthesia Post Note  Patient: Angel Benton  Procedure(s) Performed: RELEASE TRIGGER FINGER/A-1 PULLEY RIGHT THUMB (Right Thumb)  Patient location during evaluation: PACU Anesthesia Type: MAC and Bier Block Level of consciousness: awake and alert Pain management: pain level controlled Vital Signs Assessment: post-procedure vital signs reviewed and stable Respiratory status: spontaneous breathing, nonlabored ventilation, respiratory function stable and patient connected to nasal cannula oxygen Cardiovascular status: stable and blood pressure returned to baseline Postop Assessment: no apparent nausea or vomiting Anesthetic complications: no Comments: Able to move fingers right hand     Last Vitals:  Vitals:   05/04/19 0933 05/04/19 1036  BP: (!) 109/58 121/74  Pulse: 70 (!) 57  Resp:  17  Temp:  (P) 36.4 C  SpO2: 98% 97%    Last Pain:  Vitals:   05/04/19 0833  TempSrc: Oral  PainSc: 4                  Daimon Kean

## 2019-05-04 NOTE — Op Note (Signed)
05/04/2019  10:27 AM  PATIENT:  Angel Benton  69 y.o. female  PRE-OPERATIVE DIAGNOSIS:  right trigger thumb  POST-OPERATIVE DIAGNOSIS:  right trigger thumb  PROCEDURE:  Procedure(s): RELEASE TRIGGER FINGER/A-1 PULLEY RIGHT THUMB (Right)  Stenosing tenosynovitis right thumb    SURGEON:  Surgeon(s) and Role:    Vickki Hearing, MD - Primary  ASSISTANTS: none   ANESTHESIA:   regional  EBL:  5 mL   BLOOD ADMINISTERED:none  DRAINS: none   LOCAL MEDICATIONS USED:  MARCAINE     SPECIMEN:  No Specimen  DISPOSITION OF SPECIMEN:  N/A  COUNTS:  YES  TOURNIQUET:  * Missing tourniquet times found for documented tourniquets in log: [id number] *  DICTATION: .Dragon Dictation  PLAN OF CARE: Discharge to home after PACU  PATIENT DISPOSITION:  PACU - hemodynamically stable.   Delay start of Pharmacological VTE agent (>24hrs) due to surgical blood loss or risk of bleeding: not applicable  Details of surgical procedure Ms. Dishman was seen in the preop area.  The right thumb was confirmed as the surgical site chart review was completed surgeon's initials was placed on the right thumb for right trigger thumb release  Patient was taken to the operating for a Bier block which was successful  She was then prepped and draped sterilely  We made the incision over the proximal crease at the metacarpophalangeal joint divided the subcu tissue bluntly dissected the soft tissues out of the way protected the neurovascular bundle found the proximal edge of the A1 pulley and then divided sharply.  We inspected the tendon found to have a little bit of degeneration and fusiform appearance we checked the finger for flexion and found it to be well released.  After irrigation we closed with two #3 interrupted horizontal mattress sutures  We injected 10 cc of plain Marcaine  After tourniquet release we confirmed good color and capillary refill to the fingers and thumb and then she was taken to  the operating room placed in stable condition

## 2019-05-04 NOTE — Anesthesia Procedure Notes (Signed)
Anesthesia Regional Block: Bier block (IV Regional)   Pre-Anesthetic Checklist: ,, timeout performed, Correct Patient, Correct Site, Correct Laterality, Correct Procedure, Correct Position, site marked, Risks and benefits discussed,  Surgical consent,  Pre-op evaluation,  At surgeon's request and post-op pain management  Laterality: Right and Upper  Prep: alcohol swabs        Procedures:,,,,, intact distal pulses, Esmarch exsanguination, single tourniquet utilized, #20gu IV placed  Narrative:  Start time: 05/04/2019 9:59 AM

## 2019-05-04 NOTE — H&P (Signed)
Chief Complaint  Patient presents with  . Hand Problem    right trigger thumb     69-year-old female referred to me by Dr. Keeling patient has any triggering phenomenon right thumb does not want an injection.  She complains of pain over the A1 pulley right thumb with clicking and popping  Review of systems denies numbness or tingling denies any chest pain.  She does have heart disease and took Nitrostat last time was months ago.  She did run of run out of her Imdur and will talk to her doctor about restarting it before surgery  Past Medical History:  Diagnosis Date  . CAD (coronary artery disease)    a. Cath 03/2017 showed severe stenosis of the right PDA which was located distally and a small vessel.There was mild nonobstructive stenosis of the mid RCA and minimal irregularities of the LAD and left circumflex.LVEDP was normal. The PDA lesion was too small for PCI and medical therapy was recommended.  . GERD (gastroesophageal reflux disease)   . Hyperlipidemia   . Hypertension    Past Surgical History:  Procedure Laterality Date  . CHOLECYSTECTOMY    . COLONOSCOPY N/A 10/29/2013   Procedure: COLONOSCOPY;  Surgeon: Najeeb U Rehman, MD;  Location: [facility];  Service: Endoscopy;  Laterality: N/A;  730  . LEFT HEART CATH AND CORONARY ANGIOGRAPHY N/A 03/18/2017   Procedure: LEFT HEART CATH AND CORONARY ANGIOGRAPHY;  Surgeon: Cooper, Michael, MD;  Location: [facility];  Service: Cardiovascular;  Laterality: N/A;  . SHOULDER ARTHROSCOPY Left    2014 [place]  . SHOULDER ARTHROSCOPY Right 2013   cone surgery   History reviewed. No pertinent family history. Social History   Tobacco Use  . Smoking status: Never Smoker  . Smokeless tobacco: Never Used  Substance Use Topics  . Alcohol use: No  . Drug use: No    Current Outpatient Medications:  .  acetaminophen (TYLENOL) 500 MG tablet, Take 1,000 mg by mouth every 8 (eight) hours as needed (for pain)., Disp: ,  Rfl:  .  amLODipine (NORVASC) 10 MG tablet, Take 1 tablet (10 mg total) by mouth at bedtime., Disp: 30 tablet, Rfl: 6 .  chlorthalidone (HYGROTON) 25 MG tablet, Take 0.5 tablets (12.5 mg total) by mouth daily., Disp: 45 tablet, Rfl: 3 .  escitalopram (LEXAPRO) 5 MG tablet, Take 5 mg by mouth daily., Disp: , Rfl:  .  fexofenadine (ALLEGRA) 180 MG tablet, Take 180 mg by mouth daily., Disp: , Rfl:  .  lisinopril (PRINIVIL,ZESTRIL) 40 MG tablet, Take 40 mg by mouth daily., Disp: , Rfl:  .  nitroGLYCERIN (NITROSTAT) 0.4 MG SL tablet, Place 1 tablet (0.4 mg total) under the tongue every 5 (five) minutes as needed for chest pain., Disp: 25 tablet, Rfl: 3 .  omeprazole (PRILOSEC) 20 MG capsule, Take 20-40 mg by mouth daily before breakfast., Disp: , Rfl:  .  isosorbide mononitrate (IMDUR) 30 MG 24 hr tablet, Take 1 tablet (30 mg total) by mouth daily. (Patient not taking: Reported on 04/23/2019), Disp: 30 tablet, Rfl: 6  BP (!) 147/91   Pulse 75   Ht 5' 3" (1.6 m)   Wt 189 lb (85.7 kg)   BMI 33.48 kg/m   Normal appearance Oriented x3 Mood pleasant affect normal Gait normal Right thumb is tender and swollen over the A1 pulley she has normal range of motion without locking but there is clicking and popping tendon function is intact joint is stable skin   is normal pulse and color are good capillary refill is normal lymph nodes are negative sensation is intact  Impression  Encounter Diagnosis  Name Primary?  . Trigger thumb of right hand Yes    The procedure has been fully reviewed with the patient; The risks and benefits of surgery have been discussed and explained and understood. Alternative treatment has also been reviewed, questions were encouraged and answered. The postoperative plan is also been reviewed.  Release A1 pulley right thumb 

## 2019-05-04 NOTE — Discharge Instructions (Signed)

## 2019-05-04 NOTE — Anesthesia Preprocedure Evaluation (Signed)
Anesthesia Evaluation  Patient identified by MRN, date of birth, ID band Patient awake    Reviewed: Allergy & Precautions, NPO status , Patient's Chart, lab work & pertinent test results  Airway Mallampati: II  TM Distance: >3 FB Neck ROM: Full    Dental  (+) Missing, Dental Advisory Given   Pulmonary neg pulmonary ROS,    Pulmonary exam normal breath sounds clear to auscultation       Cardiovascular METS: 3 - Mets hypertension, Pt. on medications + angina with exertion + CAD  Normal cardiovascular exam Rhythm:Regular Rate:Normal  1. Severe stenosis of the right PDA (distal small vessel location) 2. Mild nonobstructive stenosis of the mid-RCA 3. Minimal irregularity of the LAD and LCx 4. Normal LVEDP  Recommend: medical therapy for chronic exertional angina. Mild CAD except for the tight lesion in the distal PDA branch which is too small and distal to place a stent.  18-Mar-2017 10:33:21 [facility] ROUTINE RECORD Sinus bradycardia Moderate voltage criteria for LVH, may be normal variant Borderline ECG No old tracing to compare Confirmed by Daneen Schick [phone]) on 03/18/2017 12:56:08 PM   Neuro/Psych negative neurological ROS  negative psych ROS   GI/Hepatic Neg liver ROS, GERD  Medicated and Controlled,  Endo/Other  negative endocrine ROS  Renal/GU negative Renal ROS     Musculoskeletal  (+) Arthritis , Osteoarthritis,    Abdominal   Peds  Hematology negative hematology ROS (+)   Anesthesia Other Findings   Reproductive/Obstetrics negative OB ROS                            Anesthesia Physical Anesthesia Plan  ASA: III  Anesthesia Plan: Bier Block and MAC and Bier Block-LIDOCAINE ONLY   Post-op Pain Management:    Induction:   PONV Risk Score and Plan: Ondansetron and Midazolam  Airway Management Planned: Nasal Cannula, Natural Airway and Simple Face  Mask  Additional Equipment:   Intra-op Plan:   Post-operative Plan:   Informed Consent: I have reviewed the patients History and Physical, chart, labs and discussed the procedure including the risks, benefits and alternatives for the proposed anesthesia with the patient or authorized representative who has indicated his/her understanding and acceptance.     Dental advisory given  Plan Discussed with: CRNA and Surgeon  Anesthesia Plan Comments:         Anesthesia Quick Evaluation

## 2019-05-04 NOTE — Brief Op Note (Signed)
05/04/2019  10:24 AM  PATIENT:  Angel Benton  69 y.o. female  PRE-OPERATIVE DIAGNOSIS:  right trigger thumb  POST-OPERATIVE DIAGNOSIS:  right trigger thumb  PROCEDURE:  Procedure(s): RELEASE TRIGGER FINGER/A-1 PULLEY RIGHT THUMB (Right)  Stenosing tenosynovitis right thumb    SURGEON:  Surgeon(s) and Role:    Vickki Hearing, MD - Primary  ASSISTANTS: none   ANESTHESIA:   regional  EBL:  5 mL   BLOOD ADMINISTERED:none  DRAINS: none   LOCAL MEDICATIONS USED:  MARCAINE     SPECIMEN:  No Specimen  DISPOSITION OF SPECIMEN:  N/A  COUNTS:  YES  TOURNIQUET:  * Missing tourniquet times found for documented tourniquets in log: [id number] *  DICTATION: .Dragon Dictation  PLAN OF CARE: Discharge to home after PACU  PATIENT DISPOSITION:  PACU - hemodynamically stable.   Delay start of Pharmacological VTE agent (>24hrs) due to surgical blood loss or risk of bleeding: not applicable  Details of surgical procedure Ms. Dishman was seen in the preop area.  The right thumb was confirmed as the surgical site chart review was completed surgeon's initials was placed on the right thumb for right trigger thumb release  Patient was taken to the operating for a Bier block which was successful  She was then prepped and draped sterilely  We made the incision over the proximal crease at the metacarpophalangeal joint divided the subcu tissue bluntly dissected the soft tissues out of the way protected the neurovascular bundle found the proximal edge of the A1 pulley and then divided sharply.  We inspected the tendon found to have a little bit of degeneration and fusiform appearance we checked the finger for flexion and found it to be well released.  After irrigation we closed with two #3 interrupted horizontal mattress sutures  We injected 10 cc of plain Marcaine  After tourniquet release we confirmed good color and capillary refill to the fingers and thumb and then she was taken to  the operating room placed in stable condition

## 2019-05-04 NOTE — Transfer of Care (Signed)
Immediate Anesthesia Transfer of Care Note  Patient: Angel Benton  Procedure(s) Performed: RELEASE TRIGGER FINGER/A-1 PULLEY RIGHT THUMB (Right Thumb)  Patient Location: PACU  Anesthesia Type:Bier block  Level of Consciousness: awake, alert , oriented and patient cooperative  Airway & Oxygen Therapy: Patient Spontanous Breathing and Patient connected to nasal cannula oxygen  Post-op Assessment: Report given to RN, Post -op Vital signs reviewed and stable and Patient moving all extremities X 4  Post vital signs: Reviewed and stable  Last Vitals:  Vitals Value Taken Time  BP 121/74 05/04/19 1035  Temp    Pulse 64 05/04/19 1037  Resp 17 05/04/19 1037  SpO2 95 % 05/04/19 1037  Vitals shown include unvalidated device data.  Last Pain:  Vitals:   05/04/19 0833  TempSrc: Oral  PainSc: 4       Patients Stated Pain Goal: 8 (05/04/19 1610)  Complications: No apparent anesthesia complications

## 2019-05-04 NOTE — Interval H&P Note (Signed)
History and Physical Interval Note:  05/04/2019 9:45 AM  Angel Benton  has presented today for surgery, with the diagnosis of right trigger thumb.  The various methods of treatment have been discussed with the patient and family. After consideration of risks, benefits and other options for treatment, the patient has consented to  Procedure(s): RELEASE TRIGGER FINGER/A-1 PULLEY RIGHT THUMB (Right) as a surgical intervention.  The patient's history has been reviewed, patient examined, no change in status, stable for surgery.  I have reviewed the patient's chart and labs.  Questions were answered to the patient's satisfaction.     Fuller [place]

## 2019-05-04 NOTE — Brief Op Note (Signed)
05/04/2019  10:27 AM  PATIENT:  Angel Benton  69 y.o. female  PRE-OPERATIVE DIAGNOSIS:  right trigger thumb  POST-OPERATIVE DIAGNOSIS:  right trigger thumb  PROCEDURE:  Procedure(s): RELEASE TRIGGER FINGER/A-1 PULLEY RIGHT THUMB (Right)  Stenosing tenosynovitis right thumb    SURGEON:  Surgeon(s) and Role:    * Ayinde Swim E, MD - Primary  ASSISTANTS: none   ANESTHESIA:   regional  EBL:  5 mL   BLOOD ADMINISTERED:none  DRAINS: none   LOCAL MEDICATIONS USED:  MARCAINE     SPECIMEN:  No Specimen  DISPOSITION OF SPECIMEN:  N/A  COUNTS:  YES  TOURNIQUET:  * Missing tourniquet times found for documented tourniquets in log: [id number] *  DICTATION: .Dragon Dictation  PLAN OF CARE: Discharge to home after PACU  PATIENT DISPOSITION:  PACU - hemodynamically stable.   Delay start of Pharmacological VTE agent (>24hrs) due to surgical blood loss or risk of bleeding: not applicable  Details of surgical procedure Ms. Dishman was seen in the preop area.  The right thumb was confirmed as the surgical site chart review was completed surgeon's initials was placed on the right thumb for right trigger thumb release  Patient was taken to the operating for a Bier block which was successful  She was then prepped and draped sterilely  We made the incision over the proximal crease at the metacarpophalangeal joint divided the subcu tissue bluntly dissected the soft tissues out of the way protected the neurovascular bundle found the proximal edge of the A1 pulley and then divided sharply.  We inspected the tendon found to have a little bit of degeneration and fusiform appearance we checked the finger for flexion and found it to be well released.  After irrigation we closed with two #3 interrupted horizontal mattress sutures  We injected 10 cc of plain Marcaine  After tourniquet release we confirmed good color and capillary refill to the fingers and thumb and then she was taken to  the operating room placed in stable condition  

## 2019-05-07 ENCOUNTER — Encounter: Payer: Self-pay | Admitting: Cardiovascular Disease

## 2019-05-07 ENCOUNTER — Telehealth (INDEPENDENT_AMBULATORY_CARE_PROVIDER_SITE_OTHER): Payer: Medicare Other | Admitting: Cardiovascular Disease

## 2019-05-07 VITALS — Ht 63.0 in | Wt 188.0 lb

## 2019-05-07 DIAGNOSIS — R079 Chest pain, unspecified: Secondary | ICD-10-CM

## 2019-05-07 DIAGNOSIS — I10 Essential (primary) hypertension: Secondary | ICD-10-CM

## 2019-05-07 DIAGNOSIS — I25118 Atherosclerotic heart disease of native coronary artery with other forms of angina pectoris: Secondary | ICD-10-CM | POA: Diagnosis not present

## 2019-05-07 DIAGNOSIS — K219 Gastro-esophageal reflux disease without esophagitis: Secondary | ICD-10-CM | POA: Diagnosis not present

## 2019-05-07 NOTE — Patient Instructions (Signed)

## 2019-05-07 NOTE — Progress Notes (Signed)
Virtual Visit via Telephone Note   This visit type was conducted due to national recommendations for restrictions regarding the COVID-19 Pandemic (e.g. social distancing) in an effort to limit this patient's exposure and mitigate transmission in our community.  Due to her co-morbid illnesses, this patient is at least at moderate risk for complications without adequate follow up.  This format is felt to be most appropriate for this patient at this time.  The patient did not have access to video technology/had technical difficulties with video requiring transitioning to audio format only (telephone).  All issues noted in this document were discussed and addressed.  No physical exam could be performed with this format.  Please refer to the patient's chart for her  consent to telehealth for [facility].   Date:  05/07/2019   ID:  Angel Benton, DOB 1950/04/11, MRN [id number]  Patient Location: Home Provider Location: Home  PCP:  Kirstie Peri, MD  Cardiologist:  Prentice Docker, MD  Electrophysiologist:  None   Evaluation Performed:  Follow-Up Visit  Chief Complaint:  CAD  History of Present Illness:    Angel Benton is a 69 y.o. female with a h/o CAD.  Coronary angiography on 03/18/2017 showed severe stenosis of the right PDA which was located distally and a small vessel. There was mild nonobstructive stenosis of the mid RCA and minimal irregularities of the LAD and left circumflex. LVEDP was normal.  Medical therapy for chronic exertional angina was recommended as the PDA lesion was too small for percutaneous intervention.  I previously prescribed Ranexa but she was told it would cost $148 per month. I started Imdur at a prior visit and symptoms significantly improved.  She had some chest pain at about 2 AM the other night. She took Tylenol, Tums, and SL nitro. It has occurred after swallowing. It is non-exertional.   She has had an EGD and was told she had H. Pylori not too  long ago.  She does walk as much as she can.   Soc Hx: Her sister in law, Valinda Party (formerly my patient), passed away in 05/27/18.   Past Medical History:  Diagnosis Date  . Arthritis   . CAD (coronary artery disease)    a. Cath 03/2017 showed severe stenosis of the right PDA which was located distally and a small vessel.There was mild nonobstructive stenosis of the mid RCA and minimal irregularities of the LAD and left circumflex.LVEDP was normal. The PDA lesion was too small for PCI and medical therapy was recommended.  Marland Kitchen GERD (gastroesophageal reflux disease)   . Hyperlipidemia   . Hypertension    Past Surgical History:  Procedure Laterality Date  . CHOLECYSTECTOMY    . COLONOSCOPY N/A 10/29/2013   Procedure: COLONOSCOPY;  Surgeon: Malissa Hippo, MD;  [facility];  Service: Endoscopy;  Laterality: N/A;  730  . LEFT HEART CATH AND CORONARY ANGIOGRAPHY N/A 03/18/2017   Procedure: LEFT HEART CATH AND CORONARY ANGIOGRAPHY;  Surgeon: Tonny Bollman, MD;  Location: [facility] INVASIVE CV LAB;  Service: Cardiovascular;  Laterality: N/A;  . SHOULDER ARTHROSCOPY Left    2014 [place]  . SHOULDER ARTHROSCOPY Right 2013   cone surgery  . TENDON REPAIR Right    elbow  . TRIGGER FINGER RELEASE Right 05/04/2019   Procedure: RELEASE TRIGGER FINGER/A-1 PULLEY RIGHT THUMB;  Surgeon: Vickki Hearing, MD;  Location: [facility];  Service: Orthopedics;  Laterality: Right;     Current Meds  Medication Sig  .  acetaminophen (TYLENOL) 500 MG tablet Take 1,000 mg by mouth every 8 (eight) hours as needed (for pain).  Marland Kitchen acetaminophen-codeine (TYLENOL #3) 300-30 MG tablet Take 1 tablet by mouth every 6 (six) hours as needed for up to 3 days for moderate pain.  Marland Kitchen amLODipine (NORVASC) 10 MG tablet Take 1 tablet (10 mg total) by mouth at bedtime.  . chlorthalidone (HYGROTON) 25 MG tablet Take 0.5 tablets (12.5 mg total) by mouth daily. (Patient taking differently: Take 12.5 mg  by mouth every other day. )  . escitalopram (LEXAPRO) 5 MG tablet Take 5 mg by mouth daily.  . fexofenadine (ALLEGRA) 180 MG tablet Take 180 mg by mouth daily as needed for allergies.   . isosorbide mononitrate (IMDUR) 30 MG 24 hr tablet Take 1 tablet (30 mg total) by mouth daily.  Marland Kitchen lisinopril (PRINIVIL,ZESTRIL) 40 MG tablet Take 40 mg by mouth daily.  . nitroGLYCERIN (NITROSTAT) 0.4 MG SL tablet Place 1 tablet (0.4 mg total) under the tongue every 5 (five) minutes as needed for chest pain.  Marland Kitchen omeprazole (PRILOSEC) 20 MG capsule Take 20-40 mg by mouth daily.      Allergies:   Penicillins   Social History   Tobacco Use  . Smoking status: Never Smoker  . Smokeless tobacco: Never Used  Substance Use Topics  . Alcohol use: No  . Drug use: No     Family Hx: The patient's family history is not on file.  ROS:   Please see the history of present illness.     All other systems reviewed and are negative.   Prior CV studies:   The following studies were reviewed today:  See above  Labs/Other Tests and Data Reviewed:    EKG:  No ECG reviewed.  Recent Labs: 04/28/2019: BUN 17; Creatinine, Ser 0.74; Hemoglobin 12.6; Platelets 335; Potassium 3.6; Sodium 137   Recent Lipid Panel No results found for: CHOL, TRIG, HDL, CHOLHDL, LDLCALC, LDLDIRECT  Wt Readings from Last 3 Encounters:  05/07/19 188 lb (85.3 kg)  04/28/19 190 lb (86.2 kg)  04/23/19 189 lb (85.7 kg)     Objective:    Vital Signs:  Ht 5\' 3"  (1.6 m)   Wt 188 lb (85.3 kg)   BMI 33.30 kg/m    VITAL SIGNS:  reviewed  ASSESSMENT & PLAN:    1. Coronary artery disease :  Anginal symptoms have significantly improved with addition of Imdur. Coronary angiography details noted above with a severe stenosis and a distal small PDA not amenable to percutaneous intervention. She could not afford Ranexa.  2. Hypertension: No changes.  3. Chest pain with swallowing/GERD: Currently takes omeprazole 20 mg. Had been on  Dexilant which helped but she couldn't afford it.   She has had an EGD and was told she had H. Pylori not too long ago. I encouraged her to follow up with GI.     COVID-19 Education: The signs and symptoms of COVID-19 were discussed with the patient and how to seek care for testing (follow up with PCP or arrange E-visit).  The importance of social distancing was discussed today.  Time:   Today, I have spent 15 minutes with the patient with telehealth technology discussing the above problems.     Medication Adjustments/Labs and Tests Ordered: Current medicines are reviewed at length with the patient today.  Concerns regarding medicines are outlined above.   Tests Ordered: No orders of the defined types were placed in this encounter.   Medication Changes: No  orders of the defined types were placed in this encounter.   Disposition:  Follow up 6 months OV  Signed, Kate Sable, MD  05/07/2019 10:07 AM    [place]

## 2019-05-18 ENCOUNTER — Ambulatory Visit (INDEPENDENT_AMBULATORY_CARE_PROVIDER_SITE_OTHER): Payer: Medicare Other | Admitting: Orthopedic Surgery

## 2019-05-18 ENCOUNTER — Other Ambulatory Visit: Payer: Self-pay

## 2019-05-18 ENCOUNTER — Encounter: Payer: Self-pay | Admitting: Orthopedic Surgery

## 2019-05-18 DIAGNOSIS — M65311 Trigger thumb, right thumb: Secondary | ICD-10-CM

## 2019-05-18 MED ORDER — SULFAMETHOXAZOLE-TRIMETHOPRIM 800-160 MG PO TABS: 1.0000 | ORAL_TABLET | Freq: Two times a day (BID) | ORAL | 0 refills | Status: DC

## 2019-05-18 NOTE — Patient Instructions (Signed)
Ok to get wet   Neosporin + bandaid   Start bactrim 1 bid

## 2019-05-18 NOTE — Progress Notes (Signed)
Chief Complaint  Patient presents with  . Routine Post Op    right trigger thumb release 4/27/1 suture removal    Status post trigger finger release postop day #14  Sutures were removed today.  Patient had a little redness around the incision.  Denies any pain  Has good flexion of the IP joint  Recommend prophylactic antibiotic.  Patient is allergic to penicillin  She will apply Neosporin and keep it covered until next week   Meds ordered this encounter  Medications  . sulfamethoxazole-trimethoprim (BACTRIM DS) 800-160 MG tablet    Sig: Take 1 tablet by mouth 2 (two) times daily.    Dispense:  14 tablet    Refill:  0    1 wk follow up

## 2019-05-25 ENCOUNTER — Encounter: Payer: Self-pay | Admitting: Orthopedic Surgery

## 2019-05-25 ENCOUNTER — Other Ambulatory Visit: Payer: Self-pay

## 2019-05-25 ENCOUNTER — Ambulatory Visit (INDEPENDENT_AMBULATORY_CARE_PROVIDER_SITE_OTHER): Payer: Medicare Other | Admitting: Orthopedic Surgery

## 2019-05-25 DIAGNOSIS — M65311 Trigger thumb, right thumb: Secondary | ICD-10-CM

## 2019-05-25 NOTE — Progress Notes (Signed)
Chief Complaint  Patient presents with  . Routine Post Op    05/04/19 right trigger finger release     Last visit:   Status post trigger finger release postop day #14   Sutures were removed today.  Patient had a little redness around the incision.  Denies any pain   Has good flexion of the IP joint   Recommend prophylactic antibiotic.  Patient is allergic to penicillin   She will apply Neosporin and keep it covered until next week   This visit   Significant improvement in the redness and tenderness around the incision there is no drainage.  There is just a little bit of skin that needs to close so we recommend Neosporin and Band-Aid and come back in 2 weeks

## 2019-06-08 ENCOUNTER — Other Ambulatory Visit: Payer: Self-pay

## 2019-06-08 ENCOUNTER — Ambulatory Visit (INDEPENDENT_AMBULATORY_CARE_PROVIDER_SITE_OTHER): Payer: Medicare Other | Admitting: Orthopedic Surgery

## 2019-06-08 ENCOUNTER — Encounter: Payer: Self-pay | Admitting: Orthopedic Surgery

## 2019-06-08 DIAGNOSIS — M65311 Trigger thumb, right thumb: Secondary | ICD-10-CM

## 2019-06-08 NOTE — Progress Notes (Signed)
Chief Complaint  Patient presents with  . Routine Post Op    05/04/19 right thumb, incision healed but still painful     C/o soreness but says she is getting better   The incision is tender but the erythema has resolved   No triggering   Return as needed   Encounter Diagnosis  Name Primary?  . Trigger thumb of right hand s/p surgical release on 05/04/19 Yes

## 2019-07-07 DIAGNOSIS — I10 Essential (primary) hypertension: Secondary | ICD-10-CM | POA: Diagnosis not present

## 2019-07-07 DIAGNOSIS — K219 Gastro-esophageal reflux disease without esophagitis: Secondary | ICD-10-CM | POA: Diagnosis not present

## 2019-07-07 DIAGNOSIS — E785 Hyperlipidemia, unspecified: Secondary | ICD-10-CM | POA: Diagnosis not present

## 2019-07-27 IMAGING — NM NM MYOCAR MULTI W/SPECT W/WALL MOTION & EF
2 series · 12 of 12 positions shown · non-contrast
Comparison: none

[Series 1: rest · 6.51mm/px · 6 of 64 frames shown]
[frame 6/64]
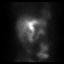
[frame 16/64]
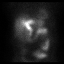
[frame 27/64]
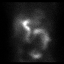
[frame 38/64]
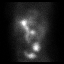
[frame 48/64]
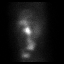
[frame 59/64]
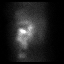

[Series 2: stress gated · 6.51mm/px · 6 of 64 frames shown]
[frame 6/64]
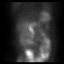
[frame 16/64]
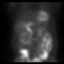
[frame 27/64]
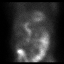
[frame 38/64]
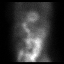
[frame 48/64]
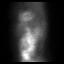
[frame 59/64]
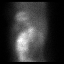

[12 of 12 positions shown; findings below may reference images not displayed]

Canned report from images found in remote index.

Refer to host system for actual result text.

## 2019-08-03 ENCOUNTER — Other Ambulatory Visit: Payer: Self-pay | Admitting: Family Medicine

## 2019-08-03 MED ORDER — NITROGLYCERIN 0.4 MG SL SUBL: 0.4000 mg | SUBLINGUAL_TABLET | SUBLINGUAL | 3 refills | Status: DC | PRN

## 2019-08-03 MED ORDER — ISOSORBIDE MONONITRATE ER 30 MG PO TB24: 30.0000 mg | ORAL_TABLET | Freq: Every day | ORAL | 6 refills | Status: DC

## 2019-08-03 NOTE — Telephone Encounter (Signed)
Pt called stating she's needing refills - sent to [facility]  isosorbide mononitrate (IMDUR) 30 MG 24 hr tablet [[id number]] ENDED  nitroGLYCERIN (NITROSTAT) 0.4 MG SL tablet [[id number]] ENDED

## 2019-08-03 NOTE — Telephone Encounter (Signed)
Refilled NTG and Imdur to [facility]

## 2019-09-07 DIAGNOSIS — K219 Gastro-esophageal reflux disease without esophagitis: Secondary | ICD-10-CM | POA: Diagnosis not present

## 2019-09-07 DIAGNOSIS — E785 Hyperlipidemia, unspecified: Secondary | ICD-10-CM | POA: Diagnosis not present

## 2019-09-07 DIAGNOSIS — I10 Essential (primary) hypertension: Secondary | ICD-10-CM | POA: Diagnosis not present

## 2019-09-09 DIAGNOSIS — Z0000 Encounter for general adult medical examination without abnormal findings: Secondary | ICD-10-CM | POA: Diagnosis not present

## 2019-09-09 DIAGNOSIS — R5383 Other fatigue: Secondary | ICD-10-CM | POA: Diagnosis not present

## 2019-09-09 DIAGNOSIS — E7800 Pure hypercholesterolemia, unspecified: Secondary | ICD-10-CM | POA: Diagnosis not present

## 2019-09-09 DIAGNOSIS — R350 Frequency of micturition: Secondary | ICD-10-CM | POA: Diagnosis not present

## 2019-09-09 DIAGNOSIS — Z79899 Other long term (current) drug therapy: Secondary | ICD-10-CM | POA: Diagnosis not present

## 2019-09-09 DIAGNOSIS — Z1331 Encounter for screening for depression: Secondary | ICD-10-CM | POA: Diagnosis not present

## 2019-09-09 DIAGNOSIS — Z299 Encounter for prophylactic measures, unspecified: Secondary | ICD-10-CM | POA: Diagnosis not present

## 2019-09-09 DIAGNOSIS — Z7189 Other specified counseling: Secondary | ICD-10-CM | POA: Diagnosis not present

## 2019-09-09 DIAGNOSIS — Z1339 Encounter for screening examination for other mental health and behavioral disorders: Secondary | ICD-10-CM | POA: Diagnosis not present

## 2019-09-09 DIAGNOSIS — Z6832 Body mass index (BMI) 32.0-32.9, adult: Secondary | ICD-10-CM | POA: Diagnosis not present

## 2019-09-09 DIAGNOSIS — N390 Urinary tract infection, site not specified: Secondary | ICD-10-CM | POA: Diagnosis not present

## 2019-09-09 DIAGNOSIS — I10 Essential (primary) hypertension: Secondary | ICD-10-CM | POA: Diagnosis not present

## 2019-10-16 DIAGNOSIS — Z23 Encounter for immunization: Secondary | ICD-10-CM | POA: Diagnosis not present

## 2019-10-26 DIAGNOSIS — Z1231 Encounter for screening mammogram for malignant neoplasm of breast: Secondary | ICD-10-CM | POA: Diagnosis not present

## 2019-11-05 DIAGNOSIS — I10 Essential (primary) hypertension: Secondary | ICD-10-CM | POA: Diagnosis not present

## 2019-11-05 DIAGNOSIS — E785 Hyperlipidemia, unspecified: Secondary | ICD-10-CM | POA: Diagnosis not present

## 2019-11-05 DIAGNOSIS — K219 Gastro-esophageal reflux disease without esophagitis: Secondary | ICD-10-CM | POA: Diagnosis not present

## 2019-11-07 DIAGNOSIS — B373 Candidiasis of vulva and vagina: Secondary | ICD-10-CM | POA: Diagnosis not present

## 2019-11-07 NOTE — Progress Notes (Deleted)
Cardiology Office Note  Date: 11/07/2019   ID: Angel Benton, DOB 1950/09/06, MRN [id number]  PCP:  Kirstie Peri, MD  Cardiologist:  No primary care provider on file. Electrophysiologist:  None   Chief Complaint: CAD, HTN, CP  History of Present Illness: Angel Benton is a 69 y.o. female with a history of CAD, HTN, chest pain, GERD  Last encounter with Dr. Purvis Sheffield 05/07/2019 via telemedicine.  Previous PDA lesion 03/18/2017 via cardiac catheterization located distally and small vessel.  Mild nonobstructive stenosis of mid RCA and minimal irregularities of the LAD and LCx.  Medical therapy for chronic exertional angina was recommended as PDA lesion was too small for PCI.  At a prior visit Dr. Purvis Sheffield had started Imdur and anginal symptoms significantly improved.  She could not afford Ranexa.  She had been experiencing some chest pain after swallowing prior to visit.  He noted it was nonexertional.  She was taking omeprazole 20 mg daily for GERD.  She had a recent EGD and was told she had H. pylori.  He encouraged follow-up with GI provider.  Past Medical History:  Diagnosis Date  . Arthritis   . CAD (coronary artery disease)    a. Cath 03/2017 showed severe stenosis of the right PDA which was located distally and a small vessel.There was mild nonobstructive stenosis of the mid RCA and minimal irregularities of the LAD and left circumflex.LVEDP was normal. The PDA lesion was too small for PCI and medical therapy was recommended.  Marland Kitchen GERD (gastroesophageal reflux disease)   . Hyperlipidemia   . Hypertension     Past Surgical History:  Procedure Laterality Date  . CHOLECYSTECTOMY    . COLONOSCOPY N/A 10/29/2013   Procedure: COLONOSCOPY;  Surgeon: Malissa Hippo, MD;  [facility];  Service: Endoscopy;  Laterality: N/A;  730  . LEFT HEART CATH AND CORONARY ANGIOGRAPHY N/A 03/18/2017   Procedure: LEFT HEART CATH AND CORONARY ANGIOGRAPHY;  Surgeon: Tonny Bollman,  MD;  Location: [facility] INVASIVE CV LAB;  Service: Cardiovascular;  Laterality: N/A;  . SHOULDER ARTHROSCOPY Left    2014 [place]  . SHOULDER ARTHROSCOPY Right 2013   cone surgery  . TENDON REPAIR Right    elbow  . TRIGGER FINGER RELEASE Right 05/04/2019   Procedure: RELEASE TRIGGER FINGER/A-1 PULLEY RIGHT THUMB;  Surgeon: Vickki Hearing, MD;  Location: [facility];  Service: Orthopedics;  Laterality: Right;    Current Outpatient Medications  Medication Sig Dispense Refill  . acetaminophen (TYLENOL) 500 MG tablet Take 1,000 mg by mouth every 8 (eight) hours as needed (for pain).    Marland Kitchen amLODipine (NORVASC) 10 MG tablet Take 1 tablet (10 mg total) by mouth at bedtime. 30 tablet 6  . chlorthalidone (HYGROTON) 25 MG tablet Take 0.5 tablets (12.5 mg total) by mouth daily. (Patient taking differently: Take 12.5 mg by mouth every other day. ) 45 tablet 3  . escitalopram (LEXAPRO) 5 MG tablet Take 5 mg by mouth daily.    . fexofenadine (ALLEGRA) 180 MG tablet Take 180 mg by mouth daily as needed for allergies.     . isosorbide mononitrate (IMDUR) 30 MG 24 hr tablet Take 1 tablet (30 mg total) by mouth daily. 30 tablet 6  . lisinopril (PRINIVIL,ZESTRIL) 40 MG tablet Take 40 mg by mouth daily.    . nitroGLYCERIN (NITROSTAT) 0.4 MG SL tablet Place 1 tablet (0.4 mg total) under the tongue every 5 (five) minutes as needed for chest pain. 25  tablet 3  . omeprazole (PRILOSEC) 20 MG capsule Take 20-40 mg by mouth daily.      No current facility-administered medications for this visit.   Allergies:  Penicillins   Social History: The patient  reports that she has never smoked. She has never used smokeless tobacco. She reports that she does not drink alcohol and does not use drugs.   Family History: The patient's family history is not on file.   ROS:  Please see the history of present illness. Otherwise, complete review of systems is positive for {NONE DEFAULTED:18576::"none"}.  All other systems are  reviewed and negative.   Physical Exam: VS:  There were no vitals taken for this visit., BMI There is no height or weight on file to calculate BMI.  Wt Readings from Last 3 Encounters:  05/07/19 188 lb (85.3 kg)  04/28/19 190 lb (86.2 kg)  04/23/19 189 lb (85.7 kg)    General: Patient appears comfortable at rest. HEENT: Conjunctiva and lids normal, oropharynx clear with moist mucosa. Neck: Supple, no elevated JVP or carotid bruits, no thyromegaly. Lungs: Clear to auscultation, nonlabored breathing at rest. Cardiac: Regular rate and rhythm, no S3 or significant systolic murmur, no pericardial rub. Abdomen: Soft, nontender, no hepatomegaly, bowel sounds present, no guarding or rebound. Extremities: No pitting edema, distal pulses 2+. Skin: Warm and dry. Musculoskeletal: No kyphosis. Neuropsychiatric: Alert and oriented x3, affect grossly appropriate.  ECG:  {EKG/Telemetry Strips Reviewed:[phone]}  Recent Labwork: 04/28/2019: BUN 17; Creatinine, Ser 0.74; Hemoglobin 12.6; Platelets 335; Potassium 3.6; Sodium 137  No results found for: CHOL, TRIG, HDL, CHOLHDL, VLDL, LDLCALC, LDLDIRECT  Other Studies Reviewed Today:   LEFT HEART CATH AND CORONARY ANGIOGRAPHY  Conclusion  1. Severe stenosis of the right PDA (distal small vessel location) 2. Mild nonobstructive stenosis of the mid-RCA 3. Minimal irregularity of the LAD and LCx 4. Normal LVEDP  Recommend: medical therapy for chronic exertional angina. Mild CAD except for the tight lesion in the distal PDA branch which is too small and distal to place a stent. Diagnostic Dominance: Right    Echocardiogram 10/17/2016 Study Conclusions   - Left ventricle: The cavity size was normal. Wall thickness was  normal. Systolic function was normal. The estimated ejection  fraction was in the range of 60% to 65%. Wall motion was normal;  there were no regional wall motion abnormalities. Left  ventricular diastolic function  parameters were normal for the  patient&'s age.  - Aortic valve: Mildly calcified annulus. Trileaflet.  - Mitral valve: There was trivial regurgitation.  - Right atrium: Central venous pressure (est): 3 mm Hg.  - Atrial septum: No defect or patent foramen ovale was identified.  - Tricuspid valve: There was trivial regurgitation.  - Pulmonary arteries: PA peak pressure: 20 mm Hg (S).  - Pericardium, extracardiac: There was no pericardial effusion.   Impressions:   - Normal LV wall thickness with LVEF 60-65% and normal diastolic  function. Trivial mitral regurgitation. Mildly calcified aortic  annulus. Trivial tricuspid regurgitation with PASP estimated 20  mmHg.     Assessment and Plan:  1. CAD in native artery   2. Essential hypertension, benign   3. Chest pain, unspecified type    1. CAD in native artery ***  2. Essential hypertension, benign ***  3. Chest pain, unspecified type ***  Medication Adjustments/Labs and Tests Ordered: Current medicines are reviewed at length with the patient today.  Concerns regarding medicines are outlined above.   Disposition: Follow-up with ***  Signed, Rennis Harding, NP 11/07/2019 6:36 PM    [facility] at [facility] [address] [place], [place], [place] [address] Phone: [phone]; Fax: [phone]

## 2019-11-08 ENCOUNTER — Ambulatory Visit: Payer: Medicare Other | Admitting: Cardiovascular Disease

## 2019-11-08 ENCOUNTER — Ambulatory Visit: Payer: Medicare Other | Admitting: Family Medicine

## 2019-11-08 DIAGNOSIS — I10 Essential (primary) hypertension: Secondary | ICD-10-CM

## 2019-11-08 DIAGNOSIS — R079 Chest pain, unspecified: Secondary | ICD-10-CM

## 2019-11-08 DIAGNOSIS — I2510 Atherosclerotic heart disease of native coronary artery without angina pectoris: Secondary | ICD-10-CM

## 2019-11-11 DIAGNOSIS — H43392 Other vitreous opacities, left eye: Secondary | ICD-10-CM | POA: Diagnosis not present

## 2019-11-17 DIAGNOSIS — I10 Essential (primary) hypertension: Secondary | ICD-10-CM | POA: Diagnosis not present

## 2019-11-17 DIAGNOSIS — Z299 Encounter for prophylactic measures, unspecified: Secondary | ICD-10-CM | POA: Diagnosis not present

## 2019-11-17 DIAGNOSIS — R350 Frequency of micturition: Secondary | ICD-10-CM | POA: Diagnosis not present

## 2019-11-17 DIAGNOSIS — N390 Urinary tract infection, site not specified: Secondary | ICD-10-CM | POA: Diagnosis not present

## 2019-11-25 IMAGING — DX DG CHEST 2V
2 series · 2 of 2 positions shown · non-contrast
Comparison: None.

CLINICAL DATA: Preop cardiac catheterization

EXAM:
CHEST - 2 VIEW

[chest pa]
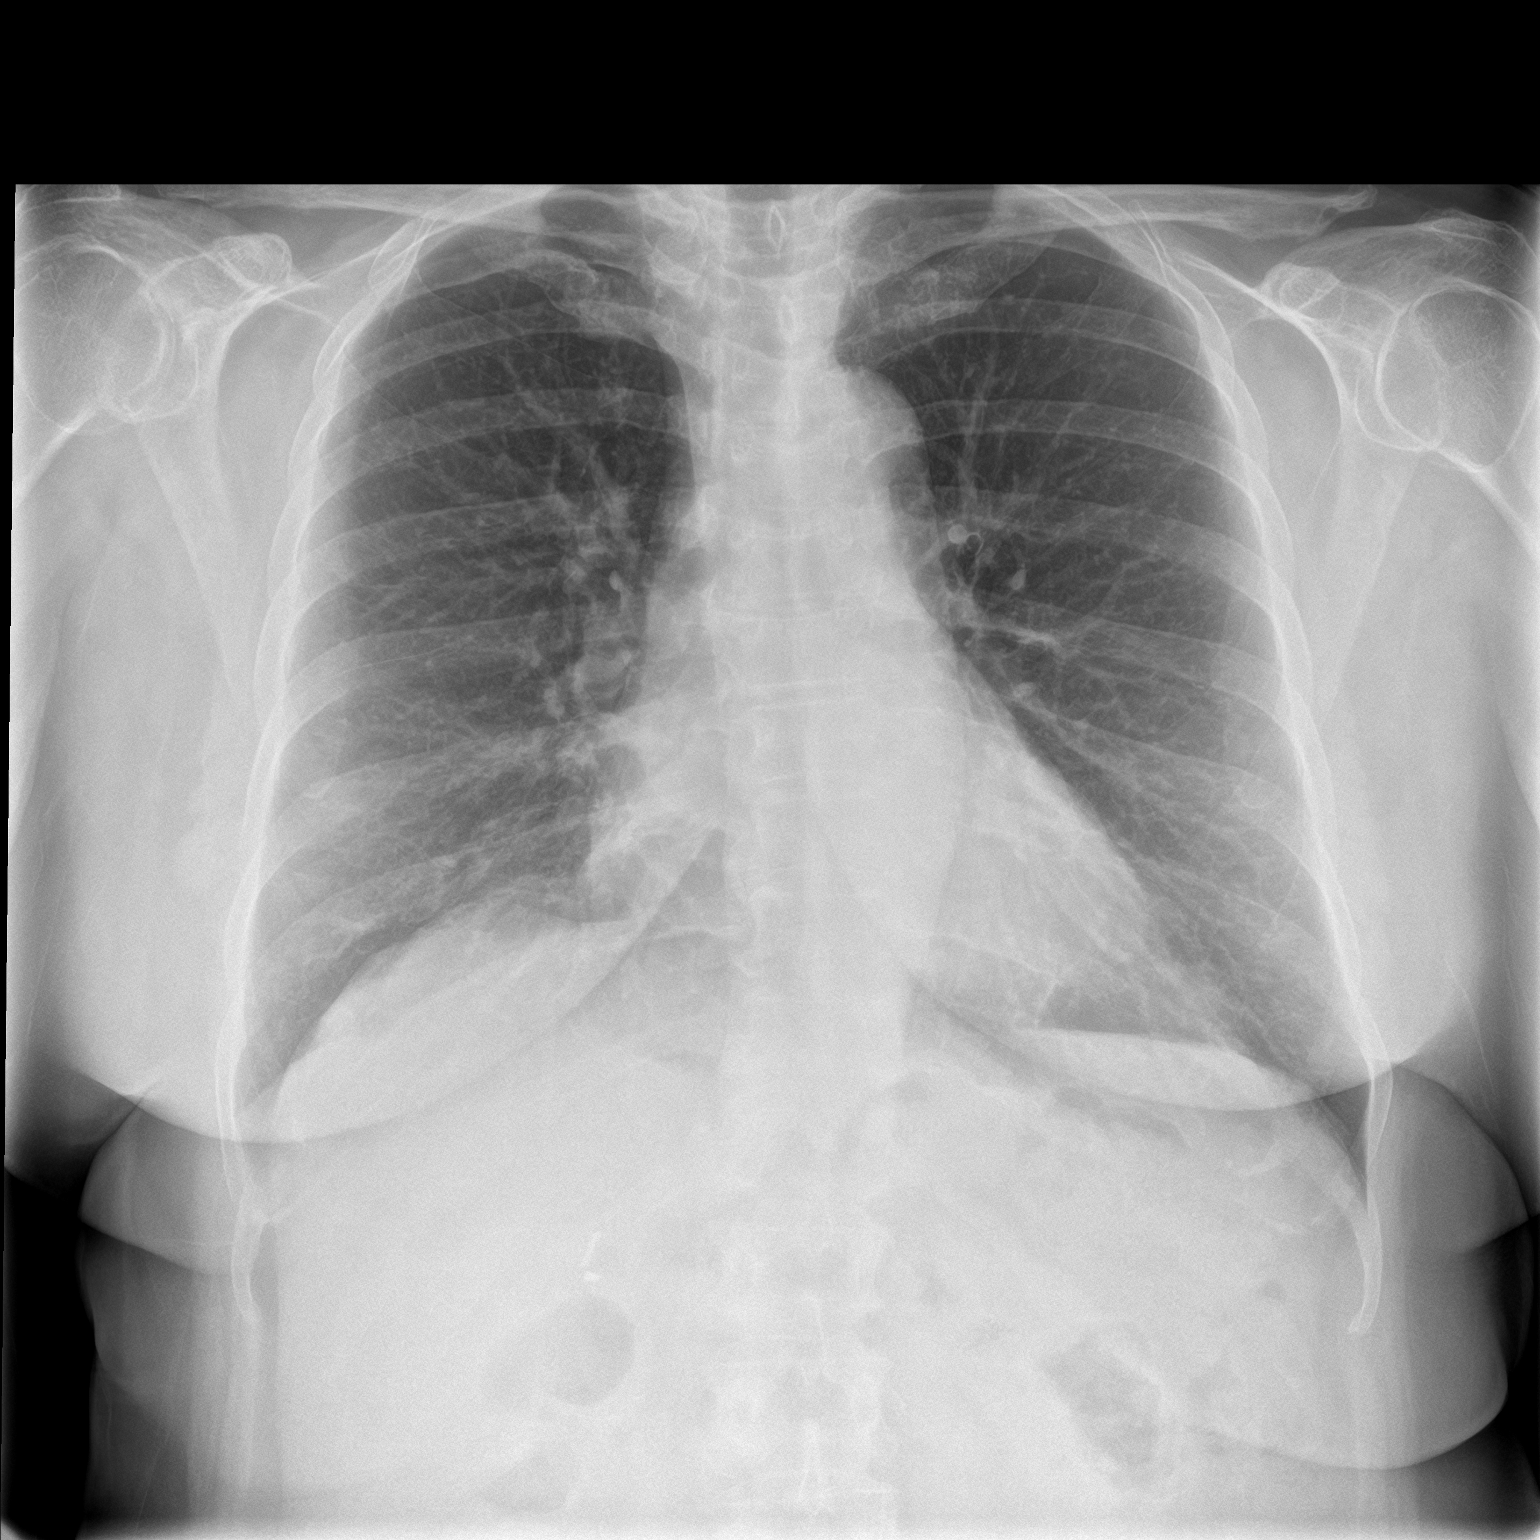

[chest lat]
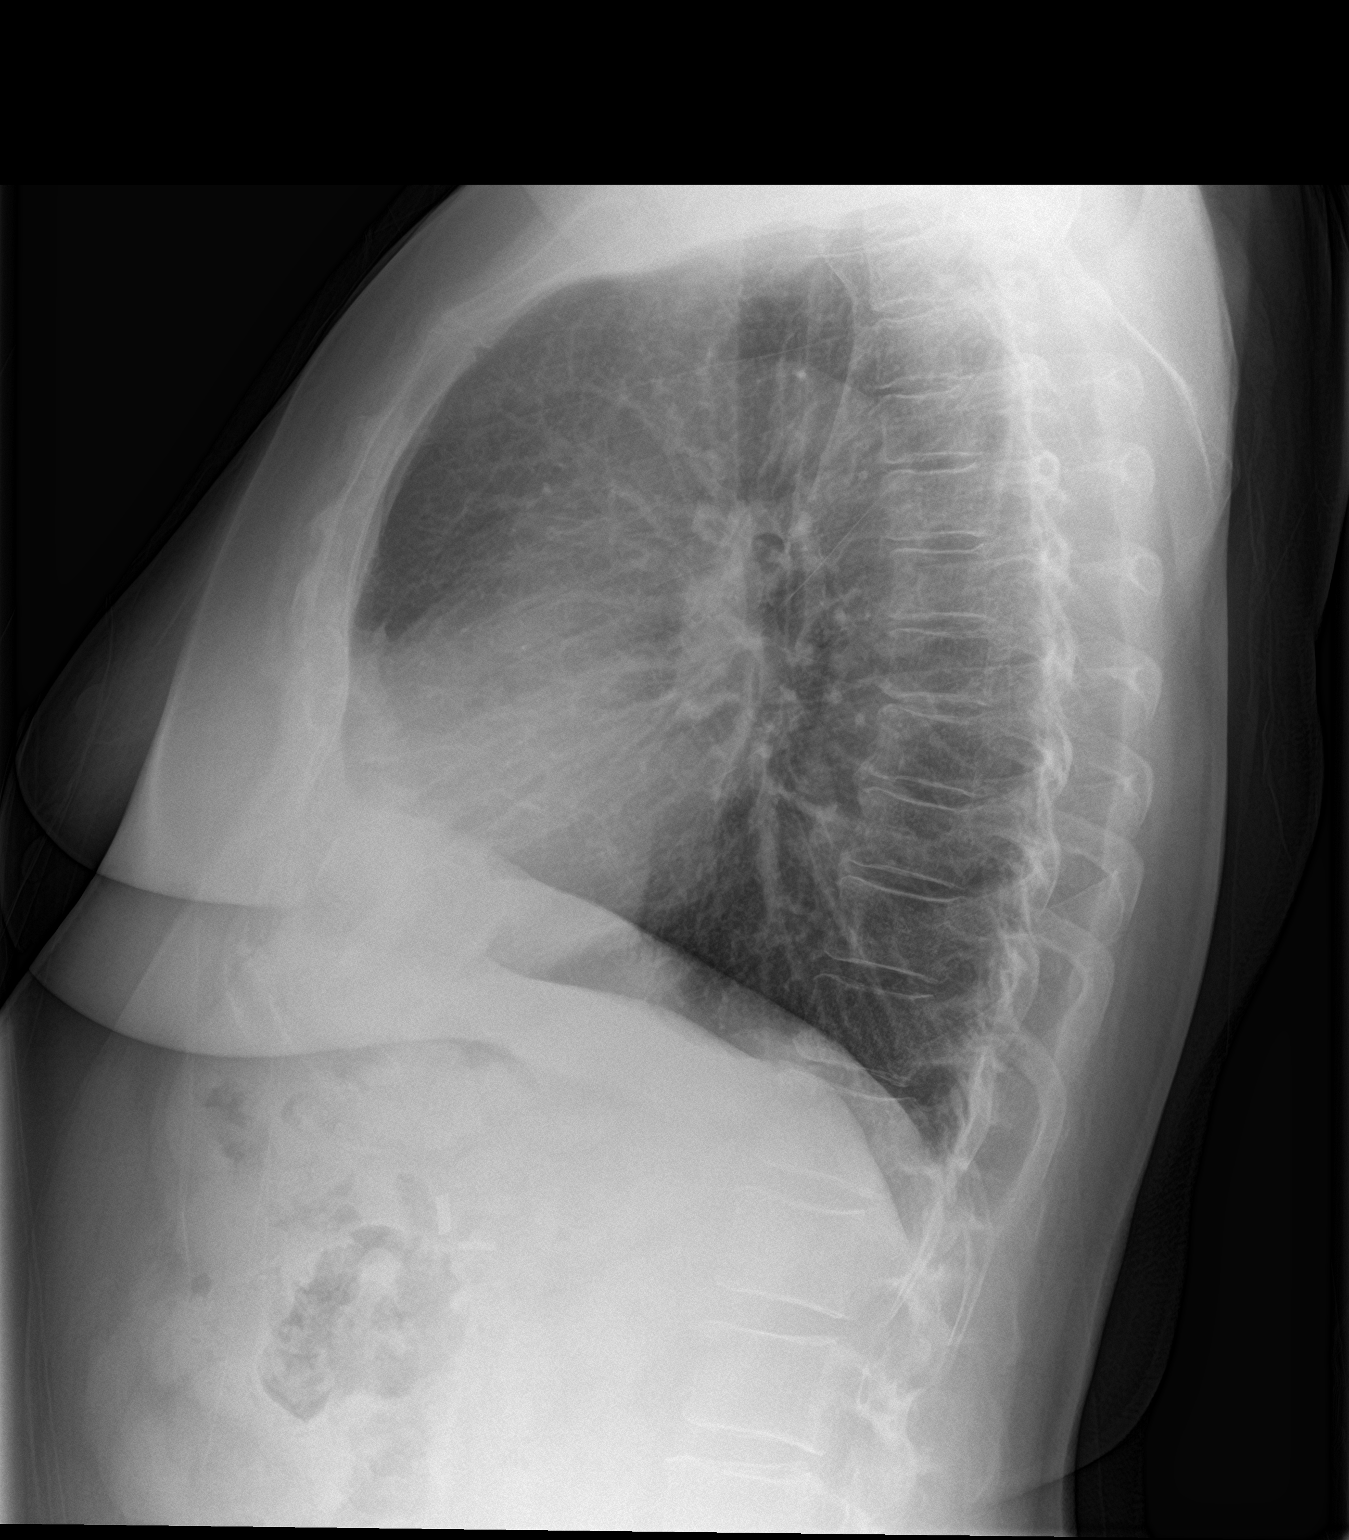

[2 of 2 positions shown; findings below may reference images not displayed]

FINDINGS: The heart size and mediastinal contours are within normal limits.
Both lungs are clear. The visualized skeletal structures are
unremarkable.
IMPRESSION: No active cardiopulmonary disease.

## 2019-12-16 DIAGNOSIS — Z299 Encounter for prophylactic measures, unspecified: Secondary | ICD-10-CM | POA: Diagnosis not present

## 2019-12-16 DIAGNOSIS — I259 Chronic ischemic heart disease, unspecified: Secondary | ICD-10-CM | POA: Diagnosis not present

## 2019-12-16 DIAGNOSIS — N390 Urinary tract infection, site not specified: Secondary | ICD-10-CM | POA: Diagnosis not present

## 2019-12-16 DIAGNOSIS — I10 Essential (primary) hypertension: Secondary | ICD-10-CM | POA: Diagnosis not present

## 2019-12-16 DIAGNOSIS — Z6833 Body mass index (BMI) 33.0-33.9, adult: Secondary | ICD-10-CM | POA: Diagnosis not present

## 2019-12-16 DIAGNOSIS — R350 Frequency of micturition: Secondary | ICD-10-CM | POA: Diagnosis not present

## 2019-12-20 DIAGNOSIS — Z23 Encounter for immunization: Secondary | ICD-10-CM | POA: Diagnosis not present

## 2020-01-07 DIAGNOSIS — E785 Hyperlipidemia, unspecified: Secondary | ICD-10-CM | POA: Diagnosis not present

## 2020-01-07 DIAGNOSIS — I10 Essential (primary) hypertension: Secondary | ICD-10-CM | POA: Diagnosis not present

## 2020-01-07 DIAGNOSIS — K219 Gastro-esophageal reflux disease without esophagitis: Secondary | ICD-10-CM | POA: Diagnosis not present

## 2020-01-19 ENCOUNTER — Ambulatory Visit: Payer: Medicare Other | Admitting: Cardiology

## 2020-01-29 DIAGNOSIS — R350 Frequency of micturition: Secondary | ICD-10-CM | POA: Diagnosis not present

## 2020-01-29 DIAGNOSIS — N3001 Acute cystitis with hematuria: Secondary | ICD-10-CM | POA: Diagnosis not present

## 2020-02-11 ENCOUNTER — Other Ambulatory Visit: Payer: Self-pay | Admitting: Family Medicine

## 2020-02-24 DIAGNOSIS — R3915 Urgency of urination: Secondary | ICD-10-CM | POA: Diagnosis not present

## 2020-02-24 DIAGNOSIS — M545 Low back pain, unspecified: Secondary | ICD-10-CM | POA: Diagnosis not present

## 2020-02-24 DIAGNOSIS — R102 Pelvic and perineal pain: Secondary | ICD-10-CM | POA: Diagnosis not present

## 2020-02-24 DIAGNOSIS — N3020 Other chronic cystitis without hematuria: Secondary | ICD-10-CM | POA: Diagnosis not present

## 2020-03-13 DIAGNOSIS — R1011 Right upper quadrant pain: Secondary | ICD-10-CM | POA: Diagnosis not present

## 2020-03-13 DIAGNOSIS — Z87891 Personal history of nicotine dependence: Secondary | ICD-10-CM | POA: Diagnosis not present

## 2020-03-13 DIAGNOSIS — I10 Essential (primary) hypertension: Secondary | ICD-10-CM | POA: Diagnosis not present

## 2020-03-13 DIAGNOSIS — K449 Diaphragmatic hernia without obstruction or gangrene: Secondary | ICD-10-CM | POA: Diagnosis not present

## 2020-03-13 DIAGNOSIS — N281 Cyst of kidney, acquired: Secondary | ICD-10-CM | POA: Diagnosis not present

## 2020-03-13 DIAGNOSIS — R110 Nausea: Secondary | ICD-10-CM | POA: Diagnosis not present

## 2020-03-13 DIAGNOSIS — Z9049 Acquired absence of other specified parts of digestive tract: Secondary | ICD-10-CM | POA: Diagnosis not present

## 2020-03-17 ENCOUNTER — Ambulatory Visit: Payer: Medicare Other | Admitting: Cardiology

## 2020-03-17 DIAGNOSIS — Z299 Encounter for prophylactic measures, unspecified: Secondary | ICD-10-CM | POA: Diagnosis not present

## 2020-03-17 DIAGNOSIS — Z87891 Personal history of nicotine dependence: Secondary | ICD-10-CM | POA: Diagnosis not present

## 2020-03-17 DIAGNOSIS — R350 Frequency of micturition: Secondary | ICD-10-CM | POA: Diagnosis not present

## 2020-03-17 DIAGNOSIS — Z6833 Body mass index (BMI) 33.0-33.9, adult: Secondary | ICD-10-CM | POA: Diagnosis not present

## 2020-03-17 DIAGNOSIS — I10 Essential (primary) hypertension: Secondary | ICD-10-CM | POA: Diagnosis not present

## 2020-03-17 DIAGNOSIS — N3020 Other chronic cystitis without hematuria: Secondary | ICD-10-CM | POA: Diagnosis not present

## 2020-03-28 ENCOUNTER — Ambulatory Visit (INDEPENDENT_AMBULATORY_CARE_PROVIDER_SITE_OTHER): Payer: Medicare Other

## 2020-03-28 ENCOUNTER — Other Ambulatory Visit: Payer: Self-pay | Admitting: Cardiology

## 2020-03-28 ENCOUNTER — Other Ambulatory Visit: Payer: Self-pay | Admitting: *Deleted

## 2020-03-28 ENCOUNTER — Encounter: Payer: Self-pay | Admitting: Cardiology

## 2020-03-28 ENCOUNTER — Ambulatory Visit (INDEPENDENT_AMBULATORY_CARE_PROVIDER_SITE_OTHER): Payer: Medicare Other | Admitting: Cardiology

## 2020-03-28 ENCOUNTER — Telehealth: Payer: Self-pay | Admitting: Cardiology

## 2020-03-28 VITALS — BP 120/72 | HR 68 | Wt 195.8 lb

## 2020-03-28 DIAGNOSIS — I25119 Atherosclerotic heart disease of native coronary artery with unspecified angina pectoris: Secondary | ICD-10-CM

## 2020-03-28 DIAGNOSIS — R002 Palpitations: Secondary | ICD-10-CM

## 2020-03-28 DIAGNOSIS — I10 Essential (primary) hypertension: Secondary | ICD-10-CM | POA: Diagnosis not present

## 2020-03-28 DIAGNOSIS — E782 Mixed hyperlipidemia: Secondary | ICD-10-CM

## 2020-03-28 DIAGNOSIS — Z79899 Other long term (current) drug therapy: Secondary | ICD-10-CM | POA: Diagnosis not present

## 2020-03-28 DIAGNOSIS — E785 Hyperlipidemia, unspecified: Secondary | ICD-10-CM | POA: Insufficient documentation

## 2020-03-28 MED ORDER — ASPIRIN EC 81 MG PO TBEC
81.0000 mg | DELAYED_RELEASE_TABLET | Freq: Every day | ORAL | 3 refills | Status: AC
Start: 2020-03-28 — End: ?

## 2020-03-28 MED ORDER — ROSUVASTATIN CALCIUM 10 MG PO TABS
10.0000 mg | ORAL_TABLET | Freq: Every day | ORAL | 1 refills | Status: DC
Start: 2020-03-28 — End: 2020-03-29

## 2020-03-28 NOTE — Telephone Encounter (Signed)
Pre-cert Verification for the following procedure     7 Day ZIO XT dx: palpitations   

## 2020-03-28 NOTE — Patient Instructions (Addendum)
Medication Instructions:   Your physician has recommended you make the following change in your medication:   Start aspirin 81 mg by mouth daily  Start crestor 10 mg by mouth daily at bedtime  Continue other medications the same  Labwork: Your physician recommends that you return for a FASTING lipid/liver profile: 3 months. Please do not eat or drink for at least 8 hours when you have this done. You may take your medications that morning with a sip of water.  This may be done at [facility] from 8:00 am - 4:00 pm. No appointment is needed.  Testing/Procedures: ZIO- Long Term Monitor Instructions   Your physician has requested you wear your ZIO patch monitor 7 days.   This is a single patch monitor.  [employer] supplies one patch monitor per enrollment.  Additional stickers are not available.   Please do not apply patch if you will be having a Nuclear Stress Test, Echocardiogram, Cardiac CT, MRI, or Chest Xray during the time frame you would be wearing the monitor. The patch cannot be worn during these tests.  You cannot remove and re-apply the ZIO XT patch monitor.     Once you have received you monitor, please review enclosed instructions.  Your monitor has already been registered assigning a specific monitor serial # to you.   Applying the monitor   Shave hair from upper left chest.   Hold abrader disc by orange tab.  Rub abrader in 40 strokes over left upper chest as indicated in your monitor instructions.   Clean area with 4 enclosed alcohol pads .  Use all pads to assure are is cleaned thoroughly.  Let dry.   Apply patch as indicated in monitor instructions.  Patch will be place under collarbone on left side of chest with arrow pointing upward.   Rub patch adhesive wings for 2 minutes.Remove white label marked "1".  Remove white label marked "2".  Rub patch adhesive wings for 2 additional minutes.   While looking in a mirror, press and release button in  center of patch.  A small green light will flash 3-4 times .  This will be your only indicator the monitor has been turned on.     Do not shower for the first 24 hours.  You may shower after the first 24 hours.   Press button if you feel a symptom. You will hear a small click.  Record Date, Time and Symptom in the Patient Log Book.   When you are ready to remove patch, follow instructions on last 2 pages of Patient Log Book.  Stick patch monitor onto last page of Patient Log Book.   Place Patient Log Book in [place] box.  Use locking tab on box and tape box closed securely.  The [employer] and [employer] has [employer] on it.  Please place in mailbox as soon as possible.  Your physician should have your test results approximately 7 days after the monitor has been mailed back to [facility].   Call [facility] Customer Care at [phone] if you have questions regarding your ZIO XT patch monitor.  Call them immediately if you see an orange light blinking on your monitor.    If your monitor falls off in less than 4 days contact our Monitor department at [phone].  If your monitor becomes loose or falls off after 4 days call [employer] at [phone] for suggestions on securing your monitor.  Follow-Up:  Your physician recommends that you schedule a  follow-up appointment in: 6 months.  Any Other Special Instructions Will Be Listed Below (If Applicable).  If you need a refill on your cardiac medications before your next appointment, please call your pharmacy.

## 2020-03-28 NOTE — Progress Notes (Signed)
Cardiology Office Note  Date: 03/28/2020   ID: Angel Benton, DOB 1950/11/22, MRN [id number]  PCP:  Kirstie Peri, MD  Cardiologist:  Nona Dell, MD Electrophysiologist:  None   Chief Complaint  Patient presents with  . Cardiac follow-up    History of Present Illness: Angel Benton is a 70 y.o. female former patient of Dr. Purvis Sheffield now presenting to establish follow-up with me.  I reviewed her records and updated the chart.  She was last assessed via telehealth encounter in April 2021.  She does not report any angina symptoms at this time.  She has had some intermittent sense of palpitations and heart skipping over the last few weeks.  States that her heart rate rate was in the 120s when she checked it with a pulse oximeter at home.  Feels more of a skipping sensation in the evenings when she is still.  She does not have any history of arrhythmia.  Lab work from 2021 indicated LDL 157 with total cholesterol 238.  She has a history of prior intolerance to Lipitor, but apparently was able to take Crestor, cannot recall why this was discontinued.  I reviewed her medications which are outlined below.  I personally reviewed her ECG today which shows normal sinus rhythm.  Past Medical History:  Diagnosis Date  . Arthritis   . CAD (coronary artery disease)    a. Cath 03/2017 showed severe stenosis of the right PDA which was located distally and a small vessel.There was mild nonobstructive stenosis of the mid RCA and minimal irregularities of the LAD and left circumflex.LVEDP was normal. The PDA lesion was too small for PCI and medical therapy was recommended.  . Essential hypertension   . GERD (gastroesophageal reflux disease)   . Hyperlipidemia     Past Surgical History:  Procedure Laterality Date  . CHOLECYSTECTOMY    . COLONOSCOPY N/A 10/29/2013   Procedure: COLONOSCOPY;  Surgeon: Malissa Hippo, MD;  [facility];  Service: Endoscopy;  Laterality: N/A;   730  . LEFT HEART CATH AND CORONARY ANGIOGRAPHY N/A 03/18/2017   Procedure: LEFT HEART CATH AND CORONARY ANGIOGRAPHY;  Surgeon: Tonny Bollman, MD;  Location: [facility] INVASIVE CV LAB;  Service: Cardiovascular;  Laterality: N/A;  . SHOULDER ARTHROSCOPY Left    2014 [place]  . SHOULDER ARTHROSCOPY Right 2013   cone surgery  . TENDON REPAIR Right    elbow  . TRIGGER FINGER RELEASE Right 05/04/2019   Procedure: RELEASE TRIGGER FINGER/A-1 PULLEY RIGHT THUMB;  Surgeon: Vickki Hearing, MD;  Location: [facility];  Service: Orthopedics;  Laterality: Right;    Current Outpatient Medications  Medication Sig Dispense Refill  . acetaminophen (TYLENOL) 500 MG tablet Take 1,000 mg by mouth every 8 (eight) hours as needed (for pain).    Marland Kitchen amLODipine (NORVASC) 10 MG tablet Take 1 tablet (10 mg total) by mouth at bedtime. 30 tablet 6  . aspirin EC 81 MG tablet Take 1 tablet (81 mg total) by mouth daily. Swallow whole. 90 tablet 3  . chlorthalidone (HYGROTON) 25 MG tablet Take 0.5 tablets (12.5 mg total) by mouth daily. (Patient taking differently: Take 12.5 mg by mouth every other day.) 45 tablet 3  . escitalopram (LEXAPRO) 5 MG tablet Take 5 mg by mouth daily.    . fexofenadine (ALLEGRA) 180 MG tablet Take 180 mg by mouth daily as needed for allergies.     . isosorbide mononitrate (IMDUR) 30 MG 24 hr tablet TAKE 1  TABLET BY MOUTH EVERY DAY 90 tablet 0  . lisinopril (PRINIVIL,ZESTRIL) 40 MG tablet Take 40 mg by mouth daily.    Marland Kitchen omeprazole (PRILOSEC) 20 MG capsule Take 20-40 mg by mouth daily.     . rosuvastatin (CRESTOR) 10 MG tablet Take 1 tablet (10 mg total) by mouth daily. 90 tablet 1  . nitroGLYCERIN (NITROSTAT) 0.4 MG SL tablet Place 1 tablet (0.4 mg total) under the tongue every 5 (five) minutes as needed for chest pain. 25 tablet 3   No current facility-administered medications for this visit.   Allergies:  Penicillins   ROS: No syncope.  Physical Exam: VS:  BP 120/72 (BP Location:  Right Arm, Patient Position: Sitting, Cuff Size: Normal)   Pulse 68   Wt 195 lb 12.8 oz (88.8 kg)   SpO2 98%   BMI 34.68 kg/m , BMI Body mass index is 34.68 kg/m.  Wt Readings from Last 3 Encounters:  03/28/20 195 lb 12.8 oz (88.8 kg)  05/07/19 188 lb (85.3 kg)  04/28/19 190 lb (86.2 kg)    General: Patient appears comfortable at rest. HEENT: Conjunctiva and lids normal, wearing a mask. Neck: Supple, no elevated JVP or carotid bruits, no thyromegaly. Lungs: Clear to auscultation, nonlabored breathing at rest. Cardiac: Regular rate and rhythm, no S3 or significant systolic murmur, no pericardial rub. Extremities: No pitting edema.  ECG:  An ECG dated 03/18/2017 was personally reviewed today and demonstrated:  Sinus bradycardia with LVH.  Recent Labwork: 04/28/2019: BUN 17; Creatinine, Ser 0.74; Hemoglobin 12.6; Platelets 335; Potassium 3.6; Sodium 137   Other Studies Reviewed Today:  Echocardiogram 10/17/2016: - Left ventricle: The cavity size was normal. Wall thickness was  normal. Systolic function was normal. The estimated ejection  fraction was in the range of 60% to 65%. Wall motion was normal;  there were no regional wall motion abnormalities. Left  ventricular diastolic function parameters were normal for the  patient&'s age.  - Aortic valve: Mildly calcified annulus. Trileaflet.  - Mitral valve: There was trivial regurgitation.  - Right atrium: Central venous pressure (est): 3 mm Hg.  - Atrial septum: No defect or patent foramen ovale was identified.  - Tricuspid valve: There was trivial regurgitation.  - Pulmonary arteries: PA peak pressure: 20 mm Hg (S).  - Pericardium, extracardiac: There was no pericardial effusion.   Impressions:   - Normal LV wall thickness with LVEF 60-65% and normal diastolic  function. Trivial mitral regurgitation. Mildly calcified aortic  annulus. Trivial tricuspid regurgitation with PASP estimated 20  mmHg.   Cardiac  catheterization 03/18/2017: 1. Severe stenosis of the right PDA (distal small vessel location) 2. Mild nonobstructive stenosis of the mid-RCA 3. Minimal irregularity of the LAD and LCx 4. Normal LVEDP  Recommend: medical therapy for chronic exertional angina. Mild CAD except for the tight lesion in the distal PDA branch which is too small and distal to place a stent.  Assessment and Plan:  1.  CAD, severe stenosis involving small segment of distal PDA and otherwise mild RCA disease by cardiac catheterization in 2019.  No active angina and ECG normal today.  Recommend aspirin 81 mg, continue Norvasc, lisinopril, and Imdur.  Also starting statin therapy.  2.  Mixed hyperlipidemia, LDL 157.  Starting Crestor 10 mg daily.  Follow-up FLP and LFTs in 3 months.  3.  Palpitations as outlined above.  7-day Zio patch ordered for further investigation.  4.  Essential hypertension, on Norvasc, chlorthalidone, and lisinopril.  Medication Adjustments/Labs and Tests  Ordered: Current medicines are reviewed at length with the patient today.  Concerns regarding medicines are outlined above.   Tests Ordered: Orders Placed This Encounter  Procedures  . Lipid panel  . Hepatic function panel  . EKG 12-Lead    Medication Changes: Meds ordered this encounter  Medications  . aspirin EC 81 MG tablet    Sig: Take 1 tablet (81 mg total) by mouth daily. Swallow whole.    Dispense:  90 tablet    Refill:  3    03/28/2020 NEW  . rosuvastatin (CRESTOR) 10 MG tablet    Sig: Take 1 tablet (10 mg total) by mouth daily.    Dispense:  90 tablet    Refill:  1    03/28/2020 NEW    Disposition:  Follow up 6 months in the [place] office.  Signed, Jonelle Sidle, MD, [facility] 03/28/2020 2:10 PM    [facility] at [facility] [address] [place], [place], [place] [address] Phone: [phone]; Fax: [phone]

## 2020-03-29 ENCOUNTER — Telehealth: Payer: Self-pay | Admitting: Cardiology

## 2020-03-29 MED ORDER — ROSUVASTATIN CALCIUM 10 MG PO TABS: 10.0000 mg | ORAL_TABLET | Freq: Every day | ORAL | 1 refills | Status: DC

## 2020-03-29 NOTE — Telephone Encounter (Signed)
Pt is unable to afford the rosuvastatin (CRESTOR) 10 MG tablet [[id number]] it's $118 a month for the generic

## 2020-03-29 NOTE — Telephone Encounter (Signed)
Advised that she can use Good Rx coupon at [facility] for a better price. See info below rosuvastatin 90 tablets 10mg  Logo of [facility] $24.79 BIN PCN [id number]  Member ID Prudencio Burly  Rx sent to [facility] for out of pocket expense and good rx coupone

## 2020-03-31 DIAGNOSIS — Z6833 Body mass index (BMI) 33.0-33.9, adult: Secondary | ICD-10-CM | POA: Diagnosis not present

## 2020-03-31 DIAGNOSIS — R002 Palpitations: Secondary | ICD-10-CM | POA: Diagnosis not present

## 2020-03-31 DIAGNOSIS — R350 Frequency of micturition: Secondary | ICD-10-CM | POA: Diagnosis not present

## 2020-03-31 DIAGNOSIS — Z299 Encounter for prophylactic measures, unspecified: Secondary | ICD-10-CM | POA: Diagnosis not present

## 2020-03-31 DIAGNOSIS — I10 Essential (primary) hypertension: Secondary | ICD-10-CM | POA: Diagnosis not present

## 2020-04-04 DIAGNOSIS — R002 Palpitations: Secondary | ICD-10-CM | POA: Diagnosis not present

## 2020-04-10 DIAGNOSIS — R002 Palpitations: Secondary | ICD-10-CM | POA: Diagnosis not present

## 2020-04-12 ENCOUNTER — Telehealth: Payer: Self-pay | Admitting: *Deleted

## 2020-04-12 MED ORDER — METOPROLOL SUCCINATE ER 25 MG PO TB24
12.5000 mg | ORAL_TABLET | Freq: Every day | ORAL | 1 refills | Status: DC
Start: 2020-04-12 — End: 2020-10-03

## 2020-04-12 NOTE — Telephone Encounter (Signed)
Patient informed, agrees and verbalized understanding of plan. Copy sent to PCP 

## 2020-04-12 NOTE — Telephone Encounter (Signed)
-----   Message from Jonelle Sidle, MD sent at 04/11/2020 10:36 AM EDT ----- Results reviewed.  Cardiac monitor shows overall rhythm is normal, but she does have episodes when heart speeds up consistent with PSVT, some of which is aberrantly conducted.  These are not prolonged events but she could certainly be feeling these as palpitations and would correspond with the elevated heart rate she has noted on her pulse oximeter.  Suggest starting Toprol-XL at 12.5 mg daily, this can be uptitrated as tolerated.

## 2020-05-19 ENCOUNTER — Other Ambulatory Visit: Payer: Self-pay | Admitting: *Deleted

## 2020-05-19 MED ORDER — CHLORTHALIDONE 25 MG PO TABS: 12.5000 mg | ORAL_TABLET | ORAL | 3 refills | Status: DC

## 2020-05-21 ENCOUNTER — Other Ambulatory Visit: Payer: Self-pay | Admitting: Family Medicine

## 2020-06-21 DIAGNOSIS — J00 Acute nasopharyngitis [common cold]: Secondary | ICD-10-CM | POA: Diagnosis not present

## 2020-06-21 DIAGNOSIS — J019 Acute sinusitis, unspecified: Secondary | ICD-10-CM | POA: Diagnosis not present

## 2020-06-21 DIAGNOSIS — Z20822 Contact with and (suspected) exposure to covid-19: Secondary | ICD-10-CM | POA: Diagnosis not present

## 2020-06-21 DIAGNOSIS — M791 Myalgia, unspecified site: Secondary | ICD-10-CM | POA: Diagnosis not present

## 2020-07-17 DIAGNOSIS — I25119 Atherosclerotic heart disease of native coronary artery with unspecified angina pectoris: Secondary | ICD-10-CM | POA: Diagnosis not present

## 2020-07-17 DIAGNOSIS — E782 Mixed hyperlipidemia: Secondary | ICD-10-CM | POA: Diagnosis not present

## 2020-07-21 ENCOUNTER — Telehealth: Payer: Self-pay | Admitting: *Deleted

## 2020-07-21 NOTE — Telephone Encounter (Signed)
-----   Message from Jonelle Sidle, MD sent at 07/17/2020  4:09 PM EDT ----- Results reviewed.  LFTs look good and her LDL has come down nicely to 76 on Crestor.  Continue with current plan.

## 2020-07-21 NOTE — Telephone Encounter (Signed)
Patient informed. Copy sent to PCP °

## 2020-07-27 DIAGNOSIS — R350 Frequency of micturition: Secondary | ICD-10-CM | POA: Diagnosis not present

## 2020-07-27 DIAGNOSIS — N390 Urinary tract infection, site not specified: Secondary | ICD-10-CM | POA: Diagnosis not present

## 2020-09-13 DIAGNOSIS — J0100 Acute maxillary sinusitis, unspecified: Secondary | ICD-10-CM | POA: Diagnosis not present

## 2020-09-26 ENCOUNTER — Other Ambulatory Visit: Payer: Self-pay | Admitting: Cardiology

## 2020-09-29 ENCOUNTER — Ambulatory Visit: Payer: Medicare Other | Admitting: Cardiology

## 2020-10-02 NOTE — Progress Notes (Signed)
Cardiology Office Note  Date: 10/03/2020   ID: TYJA GORTNEY, DOB Mar 18, 1950, MRN [id number]  PCP:  Kirstie Peri, MD  Cardiologist:  Nona Dell, MD Electrophysiologist:  None   Chief Complaint: 83-month follow-up  History of Present Illness: Angel Benton is a 70 y.o. female with a history of HTN, CAD, HLD.  She was last seen by Dr. Diona Browner on 03/28/2020 she denies any current anginal symptoms.  Had intermittent sense of palpitations and heart skipping over the prior few weeks.  Heart rate was in the 120s when she checked it with pulse oximeter at home.  She had no history of arrhythmias.  Lab work 01/27/2019 indicated LDL 157 with total cholesterol 238.  History of prior intolerance to Lipitor but apparently able to take Crestor.  Could not recall why this was discontinued.  She had no active anginal symptoms.  EKG was normal on that visit.  Recommendations were for aspirin 81 mg.  Continue Norvasc, lisinopril, Imdur.  Also starting statin therapy with Crestor 10 mg daily.  She will follow-up FLP and LFTs in 3 months.  A 7-day ZIO monitor was ordered for further investigation of palpitations.  She was continuing Norvasc, chlorthalidone, lisinopril for essential hypertension.  She is here for follow-up for recent ZIO monitor.  We discussed the results of the ZIO monitor.  Dr. Diona Browner had ordered her to start Toprol-XL 12.5 mg daily.  She states she has not started the medication yet as she was concerned about her inherently low heart rate of 62 at baseline.  She states the palpitations are not that bothersome for the most part.  She denies any associated symptoms.  Blood pressure is elevated today due to the fact that she did not take her antihypertensive medications as of yet today.  Previous blood pressure in March was 120/72.  She is tolerating her Crestor well.  She denies any statin associated muscle symptoms.  She is continuing her amlodipine 10 mg daily, chlorthalidone 12.5 mg  daily, Imdur 30 mg daily, lisinopril 40 mg daily, Crestor 10 mg daily, sublingual nitroglycerin as needed.  Recent LFTs and an FLP's were normal.    Past Medical History:  Diagnosis Date   Arthritis    CAD (coronary artery disease)    a. Cath 03/2017 showed severe stenosis of the right PDA which was located distally and a small vessel. There was mild nonobstructive stenosis of the mid RCA and minimal irregularities of the LAD and left circumflex. LVEDP was normal. The PDA lesion was too small for PCI and medical therapy was recommended.   Essential hypertension    GERD (gastroesophageal reflux disease)    Hyperlipidemia     Past Surgical History:  Procedure Laterality Date   CHOLECYSTECTOMY     COLONOSCOPY N/A 10/29/2013   Procedure: COLONOSCOPY;  Surgeon: Malissa Hippo, MD;  [facility];  Service: Endoscopy;  Laterality: N/A;  730   LEFT HEART CATH AND CORONARY ANGIOGRAPHY N/A 03/18/2017   Procedure: LEFT HEART CATH AND CORONARY ANGIOGRAPHY;  Surgeon: Tonny Bollman, MD;  Location: [facility] INVASIVE CV LAB;  Service: Cardiovascular;  Laterality: N/A;   SHOULDER ARTHROSCOPY Left    2014 [place]   SHOULDER ARTHROSCOPY Right 2013   cone surgery   TENDON REPAIR Right    elbow   TRIGGER FINGER RELEASE Right 05/04/2019   Procedure: RELEASE TRIGGER FINGER/A-1 PULLEY RIGHT THUMB;  Surgeon: Vickki Hearing, MD;  Location: [facility];  Service: Orthopedics;  Laterality:  Right;    Current Outpatient Medications  Medication Sig Dispense Refill   acetaminophen (TYLENOL) 500 MG tablet Take 1,000 mg by mouth every 8 (eight) hours as needed (for pain).     amLODipine (NORVASC) 10 MG tablet Take 1 tablet (10 mg total) by mouth at bedtime. 30 tablet 6   aspirin EC 81 MG tablet Take 1 tablet (81 mg total) by mouth daily. Swallow whole. 90 tablet 3   chlorthalidone (HYGROTON) 25 MG tablet Take 12.5 mg by mouth every other day.     escitalopram (LEXAPRO) 5 MG tablet Take 5 mg by  mouth daily.     fexofenadine (ALLEGRA) 180 MG tablet Take 180 mg by mouth daily as needed for allergies.      lisinopril (PRINIVIL,ZESTRIL) 40 MG tablet Take 40 mg by mouth daily.     nitroGLYCERIN (NITROSTAT) 0.4 MG SL tablet Place 0.4 mg under the tongue every 5 (five) minutes x 3 doses as needed for chest pain (if no relief after 2nd dose, proceed to the ED for an evaluation or call 911).     omeprazole (PRILOSEC) 20 MG capsule Take 20-40 mg by mouth daily.      rosuvastatin (CRESTOR) 10 MG tablet Take 1 tablet by mouth once daily 90 tablet 0   isosorbide mononitrate (IMDUR) 30 MG 24 hr tablet Take 1 tablet (30 mg total) by mouth daily. 90 tablet 3   metoprolol succinate (TOPROL XL) 25 MG 24 hr tablet Take 0.5 tablets (12.5 mg total) by mouth daily as needed (palpitations). 45 tablet 1   No current facility-administered medications for this visit.   Allergies:  Penicillins   Social History: The patient  reports that she has never smoked. She has never used smokeless tobacco. She reports that she does not drink alcohol and does not use drugs.   Family History: The patient's family history includes CAD in her brother; Colon cancer in her father; Heart attack in her mother.   ROS:  Please see the history of present illness. Otherwise, complete review of systems is positive for none.  All other systems are reviewed and negative.   Physical Exam: VS:  BP (!) 158/80   Pulse 62   Ht 5\' 3"  (1.6 m)   Wt 198 lb 3.2 oz (89.9 kg)   SpO2 98%   BMI 35.11 kg/m , BMI Body mass index is 35.11 kg/m.  Wt Readings from Last 3 Encounters:  10/03/20 198 lb 3.2 oz (89.9 kg)  03/28/20 195 lb 12.8 oz (88.8 kg)  05/07/19 188 lb (85.3 kg)    General: Patient appears comfortable at rest. Neck: Supple, no elevated JVP or carotid bruits, no thyromegaly. Lungs: Clear to auscultation, nonlabored breathing at rest. Cardiac: Regular rate and rhythm, no S3 or significant systolic murmur, no pericardial  rub. Extremities: No pitting edema, distal pulses 2+. Skin: Warm and dry. Musculoskeletal: No kyphosis. Neuropsychiatric: Alert and oriented x3, affect grossly appropriate.  ECG:    Recent Labwork: No results found for requested labs within last 8760 hours.  No results found for: CHOL, TRIG, HDL, CHOLHDL, VLDL, LDLCALC, LDLDIRECT  Other Studies Reviewed Today:  Zio monitor 04/11/2020 ZIO XT reviewed.  7 days 2 hours analyzed.  Predominant rhythm is sinus with heart rate ranging from 42 bpm up to 126 bpm and average heart rate 74 bpm.  There were occasional PACs representing 1.1% total beats, also rare couplets and triplets representing less than 1% total beats.  There were rare PVCs including couplets  representing less than 1% total beats.  Limited ventricular bigeminy noted.  Two episodes of NSVT versus aberrantly conducted PSVT were noted, the longest of which lasted for 12 seconds with heart rate in the 130s.  There were also episodes of PSVT the longest of which lasted for 19 beats.  No pauses noted    Echocardiogram 10/17/2016: - Left ventricle: The cavity size was normal. Wall thickness was    normal. Systolic function was normal. The estimated ejection    fraction was in the range of 60% to 65%. Wall motion was normal;    there were no regional wall motion abnormalities. Left    ventricular diastolic function parameters were normal for the    patient&'s age.  - Aortic valve: Mildly calcified annulus. Trileaflet.  - Mitral valve: There was trivial regurgitation.  - Right atrium: Central venous pressure (est): 3 mm Hg.  - Atrial septum: No defect or patent foramen ovale was identified.  - Tricuspid valve: There was trivial regurgitation.  - Pulmonary arteries: PA peak pressure: 20 mm Hg (S).  - Pericardium, extracardiac: There was no pericardial effusion.   Impressions:   - Normal LV wall thickness with LVEF 60-65% and normal diastolic    function. Trivial mitral  regurgitation. Mildly calcified aortic    annulus. Trivial tricuspid regurgitation with PASP estimated 20    mmHg.    Cardiac catheterization 03/18/2017: 1. Severe stenosis of the right PDA (distal small vessel location) 2. Mild nonobstructive stenosis of the mid-RCA 3. Minimal irregularity of the LAD and LCx 4. Normal LVEDP   Recommend: medical therapy for chronic exertional angina. Mild CAD except for the tight lesion in the distal PDA branch which is too small and distal to place a stent.    Assessment and Plan:  1. CAD in native artery   2. Mixed hyperlipidemia   3. Palpitations   4. Essential hypertension    1. CAD in native artery Denies any current anginal symptoms.  Continue aspirin 81 mg daily, Imdur 30 mg daily, sublingual nitroglycerin daily.  2. Mixed hyperlipidemia Continue Crestor 10 mg daily.  Recent lipids on 07/17/2020 TC 159, TG 97, HDL 64, LDL 76.  Continue with current plan  3. Palpitations States she does have continued palpitations but they are not particularly bothersome.  She states she has not started taking her Toprol as of yet due to her inherently low heart rate and she was concerned of heart rate slowing more if she started a daily dose of Toprol.  We discussed the fact if she chose she could choose to take the Toprol as needed for increased palpitations versus scheduled dosing if she preferred.  She prefers the as needed dosing option.  May need to advance care in the future if palpitations become more bothersome or she becomes more symptomatic.  4. Essential hypertension Blood pressure is elevated today but she did not take her antihypertensive therapy this morning.  Continue amlodipine 10 mg daily, chlorthalidone 12.5 mg daily, lisinopril 40 mg daily.  Medication Adjustments/Labs and Tests Ordered: Current medicines are reviewed at length with the patient today.  Concerns regarding medicines are outlined above.   Disposition: Follow-up with Dr.  Diona Browner or APP 6 months  Signed, Rennis Harding, NP 10/03/2020 12:15 PM    [facility] [address] [place], [place], [place] [address] Phone: [phone]; Fax: [phone]

## 2020-10-03 ENCOUNTER — Ambulatory Visit (INDEPENDENT_AMBULATORY_CARE_PROVIDER_SITE_OTHER): Payer: Medicare Other | Admitting: Family Medicine

## 2020-10-03 ENCOUNTER — Encounter: Payer: Self-pay | Admitting: Family Medicine

## 2020-10-03 VITALS — BP 158/80 | HR 62 | Ht 63.0 in | Wt 198.2 lb

## 2020-10-03 DIAGNOSIS — R002 Palpitations: Secondary | ICD-10-CM | POA: Diagnosis not present

## 2020-10-03 DIAGNOSIS — E782 Mixed hyperlipidemia: Secondary | ICD-10-CM

## 2020-10-03 DIAGNOSIS — I10 Essential (primary) hypertension: Secondary | ICD-10-CM

## 2020-10-03 DIAGNOSIS — I2510 Atherosclerotic heart disease of native coronary artery without angina pectoris: Secondary | ICD-10-CM

## 2020-10-03 MED ORDER — METOPROLOL SUCCINATE ER 25 MG PO TB24: 12.5000 mg | ORAL_TABLET | Freq: Every day | ORAL | 1 refills | Status: DC | PRN

## 2020-10-03 MED ORDER — ISOSORBIDE MONONITRATE ER 30 MG PO TB24: 30.0000 mg | ORAL_TABLET | Freq: Every day | ORAL | 3 refills | Status: DC

## 2020-10-03 NOTE — Patient Instructions (Addendum)
Medication Instructions:  Your physician has recommended you make the following change in your medication:  Change metoprolol succinate to 12.5 mg daily as needed for palpitations Continue other medications the same  Labwork: none  Testing/Procedures: none  Follow-Up: Your physician recommends that you schedule a follow-up appointment in: 6 months  Any Other Special Instructions Will Be Listed Below (If Applicable).  If you need a refill on your cardiac medications before your next appointment, please call your pharmacy.

## 2020-10-13 DIAGNOSIS — Z23 Encounter for immunization: Secondary | ICD-10-CM | POA: Diagnosis not present

## 2020-11-23 DIAGNOSIS — Z1231 Encounter for screening mammogram for malignant neoplasm of breast: Secondary | ICD-10-CM | POA: Diagnosis not present

## 2020-12-28 ENCOUNTER — Other Ambulatory Visit: Payer: Self-pay | Admitting: *Deleted

## 2020-12-28 MED ORDER — ROSUVASTATIN CALCIUM 10 MG PO TABS: 10.0000 mg | ORAL_TABLET | Freq: Every day | ORAL | 3 refills | Status: DC

## 2021-01-05 DIAGNOSIS — N390 Urinary tract infection, site not specified: Secondary | ICD-10-CM | POA: Diagnosis not present

## 2021-01-05 DIAGNOSIS — R350 Frequency of micturition: Secondary | ICD-10-CM | POA: Diagnosis not present

## 2021-01-12 DIAGNOSIS — Z1331 Encounter for screening for depression: Secondary | ICD-10-CM | POA: Diagnosis not present

## 2021-01-12 DIAGNOSIS — Z1339 Encounter for screening examination for other mental health and behavioral disorders: Secondary | ICD-10-CM | POA: Diagnosis not present

## 2021-01-12 DIAGNOSIS — R5383 Other fatigue: Secondary | ICD-10-CM | POA: Diagnosis not present

## 2021-01-12 DIAGNOSIS — E7800 Pure hypercholesterolemia, unspecified: Secondary | ICD-10-CM | POA: Diagnosis not present

## 2021-01-12 DIAGNOSIS — Z7189 Other specified counseling: Secondary | ICD-10-CM | POA: Diagnosis not present

## 2021-01-12 DIAGNOSIS — Z0000 Encounter for general adult medical examination without abnormal findings: Secondary | ICD-10-CM | POA: Diagnosis not present

## 2021-01-12 DIAGNOSIS — Z6833 Body mass index (BMI) 33.0-33.9, adult: Secondary | ICD-10-CM | POA: Diagnosis not present

## 2021-01-12 DIAGNOSIS — Z79899 Other long term (current) drug therapy: Secondary | ICD-10-CM | POA: Diagnosis not present

## 2021-01-12 DIAGNOSIS — E2839 Other primary ovarian failure: Secondary | ICD-10-CM | POA: Diagnosis not present

## 2021-01-12 DIAGNOSIS — I10 Essential (primary) hypertension: Secondary | ICD-10-CM | POA: Diagnosis not present

## 2021-01-12 DIAGNOSIS — Z299 Encounter for prophylactic measures, unspecified: Secondary | ICD-10-CM | POA: Diagnosis not present

## 2021-01-18 ENCOUNTER — Encounter (INDEPENDENT_AMBULATORY_CARE_PROVIDER_SITE_OTHER): Payer: Self-pay | Admitting: *Deleted

## 2021-03-22 NOTE — Progress Notes (Signed)
? ? ?Cardiology Office Note ? ?Date: 03/23/2021  ? ?ID: Angel Mediciancy H Show, DOB Jun 15, 1950, MRN [id number] ? ?PCP:  Kirstie PeriShah, Ashish, MD  ?Cardiologist:  Nona DellSamuel Chauncey Sciulli, MD ?Electrophysiologist:  None  ? ?Chief Complaint  ?Patient presents with  ? Cardiac follow-up  ? ? ?History of Present Illness: ?Angel Benton is a 71 y.o. female last seen in September 2022 by Mr. Vincenza HewsQuinn NP.  She is here for a routine visit.  Reports stable dyspnea on exertion, NYHA class II largely, no significant angina symptoms to require nitroglycerin. ? ?I reviewed her medications which are stable from a cardiac perspective.  She had lab work in July of last year at which time LDL was 76.  I personally reviewed her ECG today which shows normal sinus rhythm. ? ?She has occasional sense of palpitations, no sudden dizziness or syncope. ? ?Past Medical History:  ?Diagnosis Date  ? Arthritis   ? CAD (coronary artery disease)   ? a. Cath 03/2017 showed severe stenosis of the right PDA which was located distally and a small vessel. There was mild nonobstructive stenosis of the mid RCA and minimal irregularities of the LAD and left circumflex. LVEDP was normal. The PDA lesion was too small for PCI and medical therapy was recommended.  ? Essential hypertension   ? GERD (gastroesophageal reflux disease)   ? Hyperlipidemia   ? ? ?Past Surgical History:  ?Procedure Laterality Date  ? CHOLECYSTECTOMY    ? COLONOSCOPY N/A 10/29/2013  ? Procedure: COLONOSCOPY;  Surgeon: Malissa HippoNajeeb U Rehman, MD;  [facility];  Service: Endoscopy;  Laterality: N/A;  730  ? LEFT HEART CATH AND CORONARY ANGIOGRAPHY N/A 03/18/2017  ? Procedure: LEFT HEART CATH AND CORONARY ANGIOGRAPHY;  Surgeon: Tonny Bollmanooper, Michael, MD;  [facility] LAB;  Service: Cardiovascular;  Laterality: N/A;  ? SHOULDER ARTHROSCOPY Left   ? 2014   ? SHOULDER ARTHROSCOPY Right 2013  ? cone surgery  ? TENDON REPAIR Right   ? elbow  ? TRIGGER FINGER RELEASE Right 05/04/2019  ?  Procedure: RELEASE TRIGGER FINGER/A-1 PULLEY RIGHT THUMB;  Surgeon: Vickki HearingHarrison, Stanley E, MD;  Location: [facility];  Service: Orthopedics;  Laterality: Right;  ? ? ?Current Outpatient Medications  ?Medication Sig Dispense Refill  ? acetaminophen (TYLENOL) 500 MG tablet Take 1,000 mg by mouth every 8 (eight) hours as needed (for pain).    ? amLODipine (NORVASC) 10 MG tablet Take 1 tablet (10 mg total) by mouth at bedtime. 30 tablet 6  ? aspirin EC 81 MG tablet Take 1 tablet (81 mg total) by mouth daily. Swallow whole. 90 tablet 3  ? chlorthalidone (HYGROTON) 25 MG tablet Take 12.5 mg by mouth every other day.    ? escitalopram (LEXAPRO) 5 MG tablet Take 5 mg by mouth daily.    ? fexofenadine (ALLEGRA) 180 MG tablet Take 180 mg by mouth daily as needed for allergies.     ? isosorbide mononitrate (IMDUR) 30 MG 24 hr tablet Take 1 tablet (30 mg total) by mouth daily. 90 tablet 3  ? lisinopril (PRINIVIL,ZESTRIL) 40 MG tablet Take 40 mg by mouth daily.    ? metoprolol succinate (TOPROL XL) 25 MG 24 hr tablet Take 0.5 tablets (12.5 mg total) by mouth daily as needed (palpitations). 45 tablet 1  ? nitroGLYCERIN (NITROSTAT) 0.4 MG SL tablet Place 0.4 mg under the tongue every 5 (five) minutes x 3 doses as needed for chest pain (if no relief after 2nd dose, proceed to  the ED for an evaluation or call 911).    ? omeprazole (PRILOSEC) 20 MG capsule Take 20-40 mg by mouth daily.     ? rosuvastatin (CRESTOR) 10 MG tablet Take 1 tablet (10 mg total) by mouth daily. 90 tablet 3  ? ?No current facility-administered medications for this visit.  ? ?Allergies:  Penicillins  ? ?ROS: No orthopnea or PND.  Occasional leg swelling when she is on her feet a lot. ? ?Physical Exam: ?VS:  BP 118/78   Pulse 79   Ht 5\' 2"  (1.575 m)   Wt 198 lb 9.6 oz (90.1 kg)   SpO2 96%   BMI 36.32 kg/m? , BMI Body mass index is 36.32 kg/m?. ? ?Wt Readings from Last 3 Encounters:  ?03/23/21 198 lb 9.6 oz (90.1 kg)  ?10/03/20 198 lb 3.2 oz (89.9 kg)  ?03/28/20  195 lb 12.8 oz (88.8 kg)  ?  ?General: Patient appears comfortable at rest. ?HEENT: Conjunctiva and lids normal, wearing a mask. ?Neck: Supple, no elevated JVP or carotid bruits, no thyromegaly. ?Lungs: Clear to auscultation, nonlabored breathing at rest. ?Cardiac: Regular rate and rhythm, no S3 or significant systolic murmur, no pericardial rub. ?Extremities: No pitting edema. ? ?ECG:  An ECG dated 03/29/2018 was personally reviewed today and demonstrated:  Sinus rhythm. ? ?Recent Labwork: ? ?July 2022: AST 13, ALT 21, cholesterol 159, triglycerides 97, HDL 64, LDL 76 ? ?Other Studies Reviewed Today: ? ?Cardiac catheterization 03/18/2017: ?1. Severe stenosis of the right PDA (distal small vessel location) ?2. Mild nonobstructive stenosis of the mid-RCA ?3. Minimal irregularity of the LAD and LCx ?4. Normal LVEDP ? ?Cardiac monitor April 2022: ?ZIO XT reviewed.  7 days 2 hours analyzed.  Predominant rhythm is sinus with heart rate ranging from 42 bpm up to 126 bpm and average heart rate 74 bpm.  There were occasional PACs representing 1.1% total beats, also rare couplets and triplets representing less than 1% total beats.  There were rare PVCs including couplets representing less than 1% total beats.  Limited ventricular bigeminy noted.  Two episodes of NSVT versus aberrantly conducted PSVT were noted, the longest of which lasted for 12 seconds with heart rate in the 130s.  There were also episodes of PSVT the longest of which lasted for 19 beats.  No pauses noted. ? ?Assessment and Plan: ? ?1.  Medically managed CAD with significant stenosis involving small segment of distal PDA with otherwise mild RCA disease as of 2019.  She does not describe any progressive angina or nitroglycerin use.  ECG is normal today.  Continue aspirin, Imdur, Toprol-XL, Norvasc, lisinopril, and Crestor. ? ?2.  Mixed hyperlipidemia, LDL down to 76 from 157.  Continue Crestor. ? ?3.  History of palpitations with cardiac monitor showing  occasional PACs and rare PVCs with brief episodes of PSVT, some of which are aberrantly conducted.  No progressive symptoms or syncope. ? ?4.  Essential hypertension, blood pressure is well controlled today.  No changes made to current regimen. ? ?Medication Adjustments/Labs and Tests Ordered: ?Current medicines are reviewed at length with the patient today.  Concerns regarding medicines are outlined above.  ? ?Tests Ordered: ?Orders Placed This Encounter  ?Procedures  ? EKG 12-Lead  ? ? ?Medication Changes: ?No orders of the defined types were placed in this encounter. ? ? ?Disposition:  Follow up  6 months. ? ?Signed, ?2020, MD, [facility] ?03/23/2021 9:39 AM    ?[facility] at [facility] ?[address] [place],  [place], [place] [address] ?Phone: [phone]; Fax: [phone]  ?

## 2021-03-23 ENCOUNTER — Ambulatory Visit (INDEPENDENT_AMBULATORY_CARE_PROVIDER_SITE_OTHER): Payer: Medicare Other | Admitting: Cardiology

## 2021-03-23 ENCOUNTER — Encounter: Payer: Self-pay | Admitting: Cardiology

## 2021-03-23 VITALS — BP 118/78 | HR 79 | Ht 62.0 in | Wt 198.6 lb

## 2021-03-23 DIAGNOSIS — I10 Essential (primary) hypertension: Secondary | ICD-10-CM

## 2021-03-23 DIAGNOSIS — E782 Mixed hyperlipidemia: Secondary | ICD-10-CM | POA: Diagnosis not present

## 2021-03-23 DIAGNOSIS — H43811 Vitreous degeneration, right eye: Secondary | ICD-10-CM | POA: Diagnosis not present

## 2021-03-23 DIAGNOSIS — I25119 Atherosclerotic heart disease of native coronary artery with unspecified angina pectoris: Secondary | ICD-10-CM

## 2021-03-23 DIAGNOSIS — R002 Palpitations: Secondary | ICD-10-CM

## 2021-03-23 NOTE — Patient Instructions (Signed)
Medication Instructions:  Continue all current medications.   Labwork: none  Testing/Procedures: none  Follow-Up: 6 months   Any Other Special Instructions Will Be Listed Below (If Applicable).   If you need a refill on your cardiac medications before your next appointment, please call your pharmacy.  

## 2021-05-14 DIAGNOSIS — Z23 Encounter for immunization: Secondary | ICD-10-CM | POA: Diagnosis not present

## 2021-07-12 ENCOUNTER — Encounter (INDEPENDENT_AMBULATORY_CARE_PROVIDER_SITE_OTHER): Payer: Self-pay | Admitting: *Deleted

## 2021-08-03 ENCOUNTER — Ambulatory Visit: Payer: Medicare Other | Admitting: Podiatry

## 2021-09-11 DIAGNOSIS — R350 Frequency of micturition: Secondary | ICD-10-CM | POA: Diagnosis not present

## 2021-09-11 DIAGNOSIS — N390 Urinary tract infection, site not specified: Secondary | ICD-10-CM | POA: Diagnosis not present

## 2021-09-25 NOTE — Progress Notes (Deleted)
Cardiology Office Note  Date: 09/25/2021   ID: MONA ERBY, DOB Jan 19, 1950, MRN [id number]  PCP:  Kirstie Peri, MD  Cardiologist:  Nona Dell, MD Electrophysiologist:  None   No chief complaint on file.   History of Present Illness: MERIDIAN SOU is a 71 y.o. female last seen in March.  Past Medical History:  Diagnosis Date   Arthritis    CAD (coronary artery disease)    a. Cath 03/2017 showed severe stenosis of the right PDA which was located distally and a small vessel. There was mild nonobstructive stenosis of the mid RCA and minimal irregularities of the LAD and left circumflex. LVEDP was normal. The PDA lesion was too small for PCI and medical therapy was recommended.   Essential hypertension    GERD (gastroesophageal reflux disease)    Hyperlipidemia     Past Surgical History:  Procedure Laterality Date   CHOLECYSTECTOMY     COLONOSCOPY N/A 10/29/2013   Procedure: COLONOSCOPY;  Surgeon: Malissa Hippo, MD;  [facility];  Service: Endoscopy;  Laterality: N/A;  730   LEFT HEART CATH AND CORONARY ANGIOGRAPHY N/A 03/18/2017   Procedure: LEFT HEART CATH AND CORONARY ANGIOGRAPHY;  Surgeon: Tonny Bollman, MD;  Location: [facility] INVASIVE CV LAB;  Service: Cardiovascular;  Laterality: N/A;   SHOULDER ARTHROSCOPY Left    2014 [place]   SHOULDER ARTHROSCOPY Right 2013   cone surgery   TENDON REPAIR Right    elbow   TRIGGER FINGER RELEASE Right 05/04/2019   Procedure: RELEASE TRIGGER FINGER/A-1 PULLEY RIGHT THUMB;  Surgeon: Vickki Hearing, MD;  [facility];  Service: Orthopedics;  Laterality: Right;    Current Outpatient Medications  Medication Sig Dispense Refill   acetaminophen (TYLENOL) 500 MG tablet Take 1,000 mg by mouth every 8 (eight) hours as needed (for pain).     amLODipine (NORVASC) 10 MG tablet Take 1 tablet (10 mg total) by mouth at bedtime. 30 tablet 6   aspirin EC 81 MG tablet Take 1 tablet (81 mg total) by mouth  daily. Swallow whole. 90 tablet 3   chlorthalidone (HYGROTON) 25 MG tablet Take 12.5 mg by mouth every other day.     escitalopram (LEXAPRO) 5 MG tablet Take 5 mg by mouth daily.     fexofenadine (ALLEGRA) 180 MG tablet Take 180 mg by mouth daily as needed for allergies.      isosorbide mononitrate (IMDUR) 30 MG 24 hr tablet Take 1 tablet (30 mg total) by mouth daily. 90 tablet 3   lisinopril (PRINIVIL,ZESTRIL) 40 MG tablet Take 40 mg by mouth daily.     metoprolol succinate (TOPROL XL) 25 MG 24 hr tablet Take 0.5 tablets (12.5 mg total) by mouth daily as needed (palpitations). 45 tablet 1   nitroGLYCERIN (NITROSTAT) 0.4 MG SL tablet Place 0.4 mg under the tongue every 5 (five) minutes x 3 doses as needed for chest pain (if no relief after 2nd dose, proceed to the ED for an evaluation or call 911).     omeprazole (PRILOSEC) 20 MG capsule Take 20-40 mg by mouth daily.      rosuvastatin (CRESTOR) 10 MG tablet Take 1 tablet (10 mg total) by mouth daily. 90 tablet 3   No current facility-administered medications for this visit.   Allergies:  Penicillins   Social History: The patient  reports that she has never smoked. She has never used smokeless tobacco. She reports that she does not drink alcohol and does  not use drugs.   Family History: The patient's family history includes CAD in her brother; Colon cancer in her father; Heart attack in her mother.   ROS:  Please see the history of present illness. Otherwise, complete review of systems is positive for {NONE DEFAULTED:18576}.  All other systems are reviewed and negative.   Physical Exam: VS:  There were no vitals taken for this visit., BMI There is no height or weight on file to calculate BMI.  Wt Readings from Last 3 Encounters:  03/23/21 198 lb 9.6 oz (90.1 kg)  10/03/20 198 lb 3.2 oz (89.9 kg)  03/28/20 195 lb 12.8 oz (88.8 kg)    General: Patient appears comfortable at rest. HEENT: Conjunctiva and lids normal, oropharynx clear with  moist mucosa. Neck: Supple, no elevated JVP or carotid bruits, no thyromegaly. Lungs: Clear to auscultation, nonlabored breathing at rest. Cardiac: Regular rate and rhythm, no S3 or significant systolic murmur, no pericardial rub. Abdomen: Soft, nontender, no hepatomegaly, bowel sounds present, no guarding or rebound. Extremities: No pitting edema, distal pulses 2+. Skin: Warm and dry. Musculoskeletal: No kyphosis. Neuropsychiatric: Alert and oriented x3, affect grossly appropriate.  ECG:  An ECG dated 03/23/2021 was personally reviewed today and demonstrated:  Sinus rhythm.  Recent Labwork:  July 2022: AST 13, ALT 21, cholesterol 159, triglycerides 97, HDL 64, LDL 76  Other Studies Reviewed Today:  Cardiac catheterization 03/18/2017: 1. Severe stenosis of the right PDA (distal small vessel location) 2. Mild nonobstructive stenosis of the mid-RCA 3. Minimal irregularity of the LAD and LCx 4. Normal LVEDP   Cardiac monitor April 2022: ZIO XT reviewed.  7 days 2 hours analyzed.  Predominant rhythm is sinus with heart rate ranging from 42 bpm up to 126 bpm and average heart rate 74 bpm.  There were occasional PACs representing 1.1% total beats, also rare couplets and triplets representing less than 1% total beats.  There were rare PVCs including couplets representing less than 1% total beats.  Limited ventricular bigeminy noted.  Two episodes of NSVT versus aberrantly conducted PSVT were noted, the longest of which lasted for 12 seconds with heart rate in the 130s.  There were also episodes of PSVT the longest of which lasted for 19 beats.  No pauses noted.  Assessment and Plan:    Medication Adjustments/Labs and Tests Ordered: Current medicines are reviewed at length with the patient today.  Concerns regarding medicines are outlined above.   Tests Ordered: No orders of the defined types were placed in this encounter.   Medication Changes: No orders of the defined types were placed  in this encounter.   Disposition:  Follow up {follow up:15908}  Signed, Satira Sark, MD, [facility] 09/25/2021 3:26 PM    [place] [facility] [place], [place], [place] [address] Phone: [phone]; Fax: [phone]

## 2021-09-26 ENCOUNTER — Ambulatory Visit: Payer: Medicare Other | Admitting: Cardiology

## 2021-09-26 DIAGNOSIS — I25119 Atherosclerotic heart disease of native coronary artery with unspecified angina pectoris: Secondary | ICD-10-CM

## 2021-10-04 DIAGNOSIS — R350 Frequency of micturition: Secondary | ICD-10-CM | POA: Diagnosis not present

## 2021-10-04 DIAGNOSIS — N390 Urinary tract infection, site not specified: Secondary | ICD-10-CM | POA: Diagnosis not present

## 2021-10-10 DIAGNOSIS — Z23 Encounter for immunization: Secondary | ICD-10-CM | POA: Diagnosis not present

## 2021-11-26 ENCOUNTER — Other Ambulatory Visit: Payer: Self-pay | Admitting: *Deleted

## 2021-11-26 MED ORDER — ISOSORBIDE MONONITRATE ER 30 MG PO TB24: 30.0000 mg | ORAL_TABLET | Freq: Every day | ORAL | 1 refills | Status: DC

## 2021-12-04 ENCOUNTER — Other Ambulatory Visit: Payer: Self-pay | Admitting: Cardiology

## 2021-12-11 DIAGNOSIS — Z1231 Encounter for screening mammogram for malignant neoplasm of breast: Secondary | ICD-10-CM | POA: Diagnosis not present

## 2021-12-19 ENCOUNTER — Other Ambulatory Visit: Payer: Self-pay | Admitting: Cardiology

## 2022-01-17 DIAGNOSIS — E7800 Pure hypercholesterolemia, unspecified: Secondary | ICD-10-CM | POA: Diagnosis not present

## 2022-01-17 DIAGNOSIS — Z1331 Encounter for screening for depression: Secondary | ICD-10-CM | POA: Diagnosis not present

## 2022-01-17 DIAGNOSIS — Z7189 Other specified counseling: Secondary | ICD-10-CM | POA: Diagnosis not present

## 2022-01-17 DIAGNOSIS — Z1339 Encounter for screening examination for other mental health and behavioral disorders: Secondary | ICD-10-CM | POA: Diagnosis not present

## 2022-01-17 DIAGNOSIS — Z87891 Personal history of nicotine dependence: Secondary | ICD-10-CM | POA: Diagnosis not present

## 2022-01-17 DIAGNOSIS — E559 Vitamin D deficiency, unspecified: Secondary | ICD-10-CM | POA: Diagnosis not present

## 2022-01-17 DIAGNOSIS — Z299 Encounter for prophylactic measures, unspecified: Secondary | ICD-10-CM | POA: Diagnosis not present

## 2022-01-17 DIAGNOSIS — Z79899 Other long term (current) drug therapy: Secondary | ICD-10-CM | POA: Diagnosis not present

## 2022-01-17 DIAGNOSIS — R5383 Other fatigue: Secondary | ICD-10-CM | POA: Diagnosis not present

## 2022-01-17 DIAGNOSIS — Z6833 Body mass index (BMI) 33.0-33.9, adult: Secondary | ICD-10-CM | POA: Diagnosis not present

## 2022-01-17 DIAGNOSIS — Z0000 Encounter for general adult medical examination without abnormal findings: Secondary | ICD-10-CM | POA: Diagnosis not present

## 2022-01-17 DIAGNOSIS — I10 Essential (primary) hypertension: Secondary | ICD-10-CM | POA: Diagnosis not present

## 2022-01-18 ENCOUNTER — Other Ambulatory Visit: Payer: Self-pay | Admitting: Cardiology

## 2022-01-21 ENCOUNTER — Encounter (INDEPENDENT_AMBULATORY_CARE_PROVIDER_SITE_OTHER): Payer: Self-pay | Admitting: *Deleted

## 2022-01-22 DIAGNOSIS — E2839 Other primary ovarian failure: Secondary | ICD-10-CM | POA: Diagnosis not present

## 2022-02-10 DIAGNOSIS — J019 Acute sinusitis, unspecified: Secondary | ICD-10-CM | POA: Diagnosis not present

## 2022-02-10 DIAGNOSIS — B9789 Other viral agents as the cause of diseases classified elsewhere: Secondary | ICD-10-CM | POA: Diagnosis not present

## 2022-03-07 ENCOUNTER — Encounter: Payer: Self-pay | Admitting: Radiology

## 2022-03-26 ENCOUNTER — Other Ambulatory Visit: Payer: Self-pay | Admitting: Cardiology

## 2022-04-29 ENCOUNTER — Other Ambulatory Visit: Payer: Self-pay | Admitting: Cardiology

## 2022-05-09 DIAGNOSIS — H43811 Vitreous degeneration, right eye: Secondary | ICD-10-CM | POA: Diagnosis not present

## 2022-06-24 ENCOUNTER — Other Ambulatory Visit: Payer: Self-pay | Admitting: Cardiology

## 2022-07-08 ENCOUNTER — Encounter (INDEPENDENT_AMBULATORY_CARE_PROVIDER_SITE_OTHER): Payer: Self-pay | Admitting: *Deleted

## 2022-07-15 ENCOUNTER — Ambulatory Visit: Payer: Medicare Other | Attending: Nurse Practitioner | Admitting: Nurse Practitioner

## 2022-07-15 ENCOUNTER — Encounter: Payer: Self-pay | Admitting: Nurse Practitioner

## 2022-07-15 VITALS — BP 136/82 | HR 65 | Ht 63.0 in | Wt 204.8 lb

## 2022-07-15 DIAGNOSIS — E669 Obesity, unspecified: Secondary | ICD-10-CM | POA: Diagnosis not present

## 2022-07-15 DIAGNOSIS — I10 Essential (primary) hypertension: Secondary | ICD-10-CM | POA: Insufficient documentation

## 2022-07-15 DIAGNOSIS — R079 Chest pain, unspecified: Secondary | ICD-10-CM | POA: Diagnosis not present

## 2022-07-15 DIAGNOSIS — Z87898 Personal history of other specified conditions: Secondary | ICD-10-CM | POA: Insufficient documentation

## 2022-07-15 DIAGNOSIS — I25118 Atherosclerotic heart disease of native coronary artery with other forms of angina pectoris: Secondary | ICD-10-CM | POA: Insufficient documentation

## 2022-07-15 DIAGNOSIS — E785 Hyperlipidemia, unspecified: Secondary | ICD-10-CM | POA: Diagnosis not present

## 2022-07-15 MED ORDER — ROSUVASTATIN CALCIUM 20 MG PO TABS: 20.0000 mg | ORAL_TABLET | Freq: Every day | ORAL | 3 refills | Status: DC

## 2022-07-15 MED ORDER — NITROGLYCERIN 0.4 MG SL SUBL: 0.4000 mg | SUBLINGUAL_TABLET | SUBLINGUAL | 4 refills | Status: DC | PRN

## 2022-07-15 MED ORDER — ISOSORBIDE MONONITRATE ER 60 MG PO TB24: 60.0000 mg | ORAL_TABLET | Freq: Every day | ORAL | 3 refills | Status: DC

## 2022-07-15 MED ORDER — METOPROLOL SUCCINATE ER 25 MG PO TB24: 12.5000 mg | ORAL_TABLET | Freq: Every day | ORAL | 4 refills | Status: AC | PRN

## 2022-07-15 NOTE — Progress Notes (Signed)
Cardiology Office Note:  .   Date:  07/15/2022  ID:  Angel Benton, DOB [birth year], MRN [id number] PCP: Kirstie Peri, MD  [facility] Providers Cardiologist:  Nona Dell, MD    History of Present Illness: .   Angel Benton is a 72 y.o. female with a PMH of CAD, hypertension, hyperlipidemia, history of palpitations, who presents today for overdue follow-up.  Last seen by Dr. Diona Browner on March 23, 2021.  She noted stable DOE, denied any chest pain.  Endorsed occasional sense of palpitations.  Today she presents for overdue follow-up.  She endorses exertional chest pain, chronic and stable for many years. Notices when walking outside and becomes short of breath, radiation of pain to jaw and down left arm, has to stop and catch her breath. Denies any NTG use, episodes are brief in duration. Denies any palpitations, syncope, presyncope, dizziness, orthopnea, PND, swelling or significant weight changes, acute bleeding, or claudication. Does admit to weight gain. 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 Coping well and states she has a good support system.   Studies Reviewed: Marland Kitchen    EKG unavailable at this time.   Monitor 04/2020:  ZIO XT reviewed. 7 days 2 hours analyzed. Predominant rhythm is sinus with heart rate ranging from 42 bpm up to 126 bpm and average heart rate 74 bpm. There were occasional PACs representing 1.1% total beats, also rare couplets and triplets representing less than 1% total beats. There were rare PVCs including couplets representing less than 1% total beats. Limited ventricular bigeminy noted. Two episodes of NSVT versus aberrantly conducted PSVT were noted, the longest of which lasted for 12 seconds with heart rate in the 130s. There were also episodes of PSVT the longest of which lasted for 19 beats. No pauses noted.  LHC 03/2017:  1. Severe stenosis of  the right PDA (distal small vessel location) 2. Mild nonobstructive stenosis of the mid-RCA 3. Minimal irregularity of the LAD and LCx 4. Normal LVEDP   Recommend: medical therapy for chronic exertional angina. Mild CAD except for the tight lesion in the distal PDA branch which is too small and distal to place a stent.  Echo 2016/11/13:  Study Conclusions   - Left ventricle: The cavity size was normal. Wall thickness was    normal. Systolic function was normal. The estimated ejection    fraction was in the range of 60% to 65%. Wall motion was normal;    there were no regional wall motion abnormalities. Left    ventricular diastolic function parameters were normal for the    patient&'s age.  - Aortic valve: Mildly calcified annulus. Trileaflet.  - Mitral valve: There was trivial regurgitation.  - Right atrium: Central venous pressure (est): 3 mm Hg.  - Atrial septum: No defect or patent foramen ovale was identified.  - Tricuspid valve: There was trivial regurgitation.  - Pulmonary arteries: PA peak pressure: 20 mm Hg (S).  - Pericardium, extracardiac: There was no pericardial effusion.      Physical Exam:   VS:  BP 136/82   Pulse 65   Ht 5\' 3"  (1.6 m)   Wt 204 lb 12.8 oz (92.9 kg)   SpO2 96%   BMI 36.28 kg/m    Wt Readings from Last 3 Encounters:  07/15/22 204 lb 12.8 oz (92.9 kg)  03/23/21 198 lb 9.6 oz (90.1 kg)  10/03/20 198  lb 3.2 oz (89.9 kg)    GEN: Obese, 72 y.o. female in no acute distress NECK: No JVD; No carotid bruits CARDIAC: S1/S2, RRR, no murmurs, rubs, gallops RESPIRATORY:  Clear to auscultation without rales, wheezing or rhonchi  ABDOMEN: Soft, non-tender, non-distended EXTREMITIES:  No edema; No deformity   ASSESSMENT AND PLAN: .    CAD, chronic exertional chest pain LHC in 2019 revealed severe stenosis of rPDA with mild CAD elsewhere, medical management recommended. Chronic, stable exertional anginal symptoms. Will increase Imdur to 60 mg daily, instructed  to restart Aspirin as she tells me sometimes she forgets to take this. Continue amlodipine and will refill Metoprolol and NTG per her request. Will increase Crestor to 20 mg daily as mentioned below. If no improvement after max GDMT titration, plan to arrange Lexiscan. Heart healthy diet and regular cardiovascular exercise encouraged. ED precautions discussed.   HTN BP stable. Continue current medication regimen. Discussed to monitor BP at home at least 2 hours after medications and sitting for 5-10 minutes. Heart healthy diet and regular cardiovascular exercise encouraged.   HLD LDL 164 01/2022. Will increase Crestor to 20 mg daily. Plan to repeat labs at next OV. Heart healthy diet and regular cardiovascular exercise encouraged.   Hx of palpitations Denies any sense of palpitations recently. Will refill Metoprolol per her request. Heart healthy diet and regular cardiovascular exercise encouraged.   5. Obesity Weight loss via diet and exercise encouraged. Discussed the impact being overweight would have on cardiovascular risk.  Dispo: Follow-up with me or APP 6 weeks or sooner if anything changes.   Signed, Sharlene Dory, NP

## 2022-07-15 NOTE — Patient Instructions (Addendum)
Medication Instructions:  Your physician has recommended you make the following change in your medication:  Restart Asprin 81 Mg daily Increase Imdur to 60 Mg daily Increase Crestor to 20 Mg daily Continue all other medications as prescribed.   Labwork: none  Testing/Procedures: none  Follow-Up: Your physician recommends that you schedule a follow-up appointment in: 6 weeks with Philis Nettle   Any Other Special Instructions Will Be Listed Below (If Applicable).  If you need a refill on your cardiac medications before your next appointment, please call your pharmacy.

## 2022-07-15 NOTE — Addendum Note (Signed)
Addended by: Sharen Hones on: 07/15/2022 03:09 PM   Modules accepted: Orders

## 2022-07-22 DIAGNOSIS — R059 Cough, unspecified: Secondary | ICD-10-CM | POA: Diagnosis not present

## 2022-07-22 DIAGNOSIS — Z20822 Contact with and (suspected) exposure to covid-19: Secondary | ICD-10-CM | POA: Diagnosis not present

## 2022-07-22 DIAGNOSIS — U071 COVID-19: Secondary | ICD-10-CM | POA: Diagnosis not present

## 2022-08-26 ENCOUNTER — Encounter: Payer: Self-pay | Admitting: Nurse Practitioner

## 2022-08-26 ENCOUNTER — Ambulatory Visit: Payer: Medicare Other | Attending: Nurse Practitioner | Admitting: Nurse Practitioner

## 2022-08-26 VITALS — BP 136/86 | HR 95 | Ht 63.0 in | Wt 202.0 lb

## 2022-08-26 DIAGNOSIS — I25118 Atherosclerotic heart disease of native coronary artery with other forms of angina pectoris: Secondary | ICD-10-CM | POA: Diagnosis not present

## 2022-08-26 DIAGNOSIS — R079 Chest pain, unspecified: Secondary | ICD-10-CM | POA: Insufficient documentation

## 2022-08-26 DIAGNOSIS — E785 Hyperlipidemia, unspecified: Secondary | ICD-10-CM | POA: Diagnosis not present

## 2022-08-26 DIAGNOSIS — E669 Obesity, unspecified: Secondary | ICD-10-CM | POA: Insufficient documentation

## 2022-08-26 DIAGNOSIS — I10 Essential (primary) hypertension: Secondary | ICD-10-CM | POA: Diagnosis not present

## 2022-08-26 MED ORDER — ISOSORBIDE MONONITRATE ER 60 MG PO TB24: 90.0000 mg | ORAL_TABLET | Freq: Every day | ORAL | 3 refills | Status: DC

## 2022-08-26 MED ORDER — METOPROLOL TARTRATE 100 MG PO TABS: 100.0000 mg | ORAL_TABLET | Freq: Once | ORAL | 0 refills | Status: DC | PRN

## 2022-08-26 MED ORDER — CHLORTHALIDONE 25 MG PO TABS: 12.5000 mg | ORAL_TABLET | ORAL | 3 refills | Status: DC

## 2022-08-26 NOTE — Patient Instructions (Addendum)
Medication Instructions:  Your physician has recommended you make the following change in your medication:  Increase IMDUR to 90 Mg Daily   Labwork: CMET, CBC, FLP in 1 week   Testing/Procedures:   Your cardiac CT will be scheduled at one of the below locations:   [facility] [address] [place], [place] [address] [phone]  If scheduled at [facility], please arrive at the [facility] and Children's Entrance (Entrance C2) of [facility] 30 minutes prior to test start time. You can use the FREE valet parking offered at entrance C (encouraged to control the heart rate for the test)  Proceed to the [facility] Radiology Department (first floor) to check-in and test prep.  All radiology patients and guests should use entrance C2 at [facility], accessed from [facility], even though the hospital's physical address listed is [address]    If scheduled at [facility] or [facility], please arrive 15 mins early for check-in and test prep.  There is spacious parking and easy access to the radiology department from the [facility] Vascular entrance. Please enter here and check-in with the desk attendant.   Please follow these instructions carefully (unless otherwise directed):  An IV will be required for this test and Nitroglycerin will be given.  On the Night Before the Test: Be sure to Drink plenty of water. Do not consume any caffeinated/decaffeinated beverages or chocolate 12 hours prior to your test. Do not take any antihistamines 12 hours prior to your test. If the patient has contrast allergy:No allergy   On the Day of the Test: Drink plenty of water until 1 hour prior to the test. Do not eat any food 1 hour prior to test. You may take your regular medications prior to the test.  Take metoprolol 100 MG (Lopressor) two hours prior to test. FEMALES- please wear  underwire-free bra if available, avoid dresses & tight clothing After the Test: Drink plenty of water. After receiving IV contrast, you may experience a mild flushed feeling. This is normal. On occasion, you may experience a mild rash up to 24 hours after the test. This is not dangerous. If this occurs, you can take Benadryl 25 mg and increase your fluid intake. If you experience trouble breathing, this can be serious. If it is severe call 911 IMMEDIATELY. If it is mild, please call our office. We will call to schedule your test 2-4 weeks out understanding that some insurance companies will need an authorization prior to the service being performed.   For more information and frequently asked questions, please visit our website : [email or web address]  For non-scheduling related questions, please contact the cardiac imaging nurse navigator should you have any questions/concerns: Cardiac Imaging Nurse Navigators Direct Office Dial: [phone]   For scheduling needs, including cancellations and rescheduling, please call [place], [phone].  Follow-Up: Your physician recommends that you schedule a follow-up appointment in: 6-8 weeks  Any Other Special Instructions Will Be Listed Below (If Applicable).  If you need a refill on your cardiac medications before your next appointment, please call your pharmacy.

## 2022-08-26 NOTE — Progress Notes (Signed)
Cardiology Office Note:  .   Date:  08/26/2022  ID:  Angel Benton, DOB 06-May-1950, MRN [id number] PCP: Kirstie Peri, MD  [facility] Providers Cardiologist:  Nona Dell, MD    History of Present Illness: .   Angel Benton is a 72 y.o. female with a PMH of CAD, hypertension, hyperlipidemia, history of palpitations, who presents today for overdue follow-up.  Last seen by Dr. Diona Browner on March 23, 2021.  She noted stable DOE, denied any chest pain.  Endorsed occasional sense of palpitations.  Today she presents for follow-up.  Continues to endorse exertional chest pain, chronic, does notice some improvement after medication adjustment at last visit. Denies any jaw pain or radiation to left arm. Still continues to express shortness of breath, particularly symptoms occur when going up hill during exercise or going upstairs.  Denies any palpitations, syncope, presyncope, dizziness, orthopnea, PND, swelling or significant weight changes, acute bleeding, or claudication. Says she has recovered from contracting Covid-19, believes grief has affected her symptoms. 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 Coping well and states she has a good support system.   Studies Reviewed: Marland Kitchen    EKG: EKG Interpretation Date/Time:  Monday August 26 2022 14:02:38 EDT Ventricular Rate:  92 PR Interval:  172 QRS Duration:  72 QT Interval:  364 QTC Calculation: 450 R Axis:   -4  Text Interpretation: Normal sinus rhythm Cannot rule out Anterior infarct , age undetermined When compared with ECG of 18-Mar-2017 10:33, Vent. rate has increased BY  37 BPM Confirmed by Sharlene Dory [phone]) on 08/26/2022 2:07:37 PM   Monitor 04/2020:  ZIO XT reviewed. 7 days 2 hours analyzed. Predominant rhythm is sinus with heart rate ranging from 42 bpm up to 126 bpm and average heart rate 74 bpm. There were  occasional PACs representing 1.1% total beats, also rare couplets and triplets representing less than 1% total beats. There were rare PVCs including couplets representing less than 1% total beats. Limited ventricular bigeminy noted. Two episodes of NSVT versus aberrantly conducted PSVT were noted, the longest of which lasted for 12 seconds with heart rate in the 130s. There were also episodes of PSVT the longest of which lasted for 19 beats. No pauses noted.  LHC 03/2017:  1. Severe stenosis of the right PDA (distal small vessel location) 2. Mild nonobstructive stenosis of the mid-RCA 3. Minimal irregularity of the LAD and LCx 4. Normal LVEDP   Recommend: medical therapy for chronic exertional angina. Mild CAD except for the tight lesion in the distal PDA branch which is too small and distal to place a stent.  Echo 11-06-16:  Study Conclusions   - Left ventricle: The cavity size was normal. Wall thickness was    normal. Systolic function was normal. The estimated ejection    fraction was in the range of 60% to 65%. Wall motion was normal;    there were no regional wall motion abnormalities. Left    ventricular diastolic function parameters were normal for the    patient&'s age.  - Aortic valve: Mildly calcified annulus. Trileaflet.  - Mitral valve: There was trivial regurgitation.  - Right atrium: Central venous pressure (est): 3 mm Hg.  - Atrial septum: No defect or patent foramen ovale was identified.  - Tricuspid valve: There was trivial regurgitation.  - Pulmonary arteries: PA peak pressure: 20 mm Hg (S).  - Pericardium,  extracardiac: There was no pericardial effusion.      Physical Exam:   VS:  BP 136/86   Pulse 95   Ht 5\' 3"  (1.6 m)   Wt 202 lb (91.6 kg)   SpO2 96%   BMI 35.78 kg/m    Wt Readings from Last 3 Encounters:  08/26/22 202 lb (91.6 kg)  07/15/22 204 lb 12.8 oz (92.9 kg)  03/23/21 198 lb 9.6 oz (90.1 kg)    GEN: Obese, 72 y.o. female in no acute distress NECK:  No JVD; No carotid bruits CARDIAC: S1/S2, RRR, no murmurs, rubs, gallops RESPIRATORY:  Clear to auscultation without rales, wheezing or rhonchi  ABDOMEN: Soft, non-tender, non-distended EXTREMITIES:  No edema; No deformity   ASSESSMENT AND PLAN: .    CAD, chronic exertional chest pain LHC in 2019 revealed severe stenosis of rPDA with mild CAD elsewhere, medical management recommended. Chronic, stable exertional anginal symptoms. Discussed ischemic evaluation, will proceed with CCTA, and will have her take Metoprolol tartrate 100 mg once 2 hours prior to testing (will hold Toprol XL on this day) and obtain CMET prior to testing. Will increase Imdur to 90 mg daily. Continue rest of mediation regimen. Heart healthy diet and regular cardiovascular exercise encouraged. ED precautions discussed.   HTN BP stable. Continue current medication regimen. Discussed to monitor BP at home at least 2 hours after medications and sitting for 5-10 minutes. Heart healthy diet and regular cardiovascular exercise encouraged. Will obtain CMET.   HLD LDL 164 01/2022. Continue Crestor 20 mg daily. Will obtain FLP and CMET at this time. Heart healthy diet and regular cardiovascular exercise encouraged.   5. Obesity Weight loss via diet and exercise encouraged. Discussed the impact being overweight would have on cardiovascular risk.  Dispo: Will provide refill per her request. Follow-up with me or APP 6-8 weeks or sooner if anything changes.   Signed, Sharlene Dory, NP

## 2022-08-27 DIAGNOSIS — I25118 Atherosclerotic heart disease of native coronary artery with other forms of angina pectoris: Secondary | ICD-10-CM | POA: Diagnosis not present

## 2022-08-27 DIAGNOSIS — E782 Mixed hyperlipidemia: Secondary | ICD-10-CM | POA: Diagnosis not present

## 2022-08-29 ENCOUNTER — Encounter (HOSPITAL_COMMUNITY): Payer: Self-pay

## 2022-08-30 ENCOUNTER — Telehealth (HOSPITAL_COMMUNITY): Payer: Self-pay | Admitting: *Deleted

## 2022-08-30 NOTE — Telephone Encounter (Signed)
Reaching out to patient to offer assistance regarding upcoming cardiac imaging study; pt verbalizes understanding of appt date/time, parking situation and where to check in, pre-test NPO status and medications ordered, and verified current allergies; name and call back number provided for further questions should they arise Hayley Sharpe RN Navigator Cardiac Imaging [facility] [phone] office [phone] cell  

## 2022-09-02 ENCOUNTER — Ambulatory Visit (HOSPITAL_COMMUNITY)
Admission: RE | Admit: 2022-09-02 | Discharge: 2022-09-02 | Disposition: A | Discharge status: Home / Self Care | Payer: Medicare Other | Source: Ambulatory Visit | Attending: Nurse Practitioner | Admitting: Nurse Practitioner

## 2022-09-02 DIAGNOSIS — I2510 Atherosclerotic heart disease of native coronary artery without angina pectoris: Secondary | ICD-10-CM | POA: Diagnosis not present

## 2022-09-02 DIAGNOSIS — I25119 Atherosclerotic heart disease of native coronary artery with unspecified angina pectoris: Secondary | ICD-10-CM | POA: Diagnosis not present

## 2022-09-02 DIAGNOSIS — R079 Chest pain, unspecified: Secondary | ICD-10-CM | POA: Insufficient documentation

## 2022-09-02 DIAGNOSIS — I517 Cardiomegaly: Secondary | ICD-10-CM | POA: Diagnosis not present

## 2022-09-02 MED ORDER — IOHEXOL 350 MG/ML SOLN
95.0000 mL | Freq: Once | INTRAVENOUS | Status: AC | PRN
  Administered 2022-09-02: 95 mL via INTRAVENOUS

## 2022-09-02 MED ORDER — NITROGLYCERIN 0.4 MG SL SUBL
SUBLINGUAL_TABLET | SUBLINGUAL | Status: AC
  Filled 2022-09-02: qty 2

## 2022-09-02 MED ORDER — NITROGLYCERIN 0.4 MG SL SUBL
0.8000 mg | SUBLINGUAL_TABLET | Freq: Once | SUBLINGUAL | Status: AC
  Administered 2022-09-02: 0.8 mg via SUBLINGUAL

## 2022-10-17 DIAGNOSIS — Z23 Encounter for immunization: Secondary | ICD-10-CM | POA: Diagnosis not present

## 2022-10-21 ENCOUNTER — Encounter: Payer: Self-pay | Admitting: Nurse Practitioner

## 2022-10-21 ENCOUNTER — Ambulatory Visit: Payer: Medicare Other | Attending: Nurse Practitioner | Admitting: Nurse Practitioner

## 2022-10-21 VITALS — BP 128/80 | HR 76 | Ht 63.0 in | Wt 202.8 lb

## 2022-10-21 DIAGNOSIS — I10 Essential (primary) hypertension: Secondary | ICD-10-CM | POA: Diagnosis not present

## 2022-10-21 DIAGNOSIS — K219 Gastro-esophageal reflux disease without esophagitis: Secondary | ICD-10-CM | POA: Diagnosis not present

## 2022-10-21 DIAGNOSIS — I25119 Atherosclerotic heart disease of native coronary artery with unspecified angina pectoris: Secondary | ICD-10-CM

## 2022-10-21 DIAGNOSIS — R079 Chest pain, unspecified: Secondary | ICD-10-CM

## 2022-10-21 DIAGNOSIS — E669 Obesity, unspecified: Secondary | ICD-10-CM | POA: Diagnosis not present

## 2022-10-21 DIAGNOSIS — E785 Hyperlipidemia, unspecified: Secondary | ICD-10-CM

## 2022-10-21 NOTE — Patient Instructions (Addendum)

## 2022-10-21 NOTE — Progress Notes (Signed)
Cardiology Office Note:  .   Date:  10/21/2022  ID:  Angel Benton, DOB Jul 24, 1950, MRN [id number] PCP: Kirstie Peri, MD  [facility] Providers Cardiologist:  Nona Dell, MD    History of Present Illness: .   Angel Benton is a 72 y.o. female with a PMH of CAD, hypertension, hyperlipidemia, history of palpitations, who presents today for overdue follow-up.  Last seen by Dr. Diona Browner on March 23, 2021.  She noted stable DOE, denied any chest pain.  Endorsed occasional sense of palpitations.  08/26/2022 - Today she presents for follow-up.  Continues to endorse exertional chest pain, chronic, does notice some improvement after medication adjustment at last visit. Denies any jaw pain or radiation to left arm. Still continues to express shortness of breath, particularly symptoms occur when going up hill during exercise or going upstairs.  Denies any palpitations, syncope, presyncope, dizziness, orthopnea, PND, swelling or significant weight changes, acute bleeding, or claudication. Says she has recovered from contracting Covid-19, believes grief has affected her symptoms. 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 Coping well and states she has a good support system.   Underwent CCTA on 09/02/2022 - see report below.  10/21/2022 -today she presents for follow-up.  Says she feels as though she is doing better since I last saw her.  Still having intermittent chest pain, not as severe as previously.  She has not been as active so it for her difficult to tell if she has been having exertional chest pain.  She feels as though the medication change has helped some.  Attributes her symptoms to GERD, and unsure if this is mainly causing her symptoms. Denies any shortness of breath, palpitations, syncope, presyncope, dizziness, orthopnea, PND, swelling or significant weight changes, acute  bleeding, or claudication.  Studies Reviewed: Marland Kitchen    CCTA 08/2022: IMPRESSION: 1. Coronary calcium score of 334. This was 86th percentile for age and sex matched control.   2. Total plaque volume 319mm3 which is 63rd percentile for age and sex-matched controls (calcified plaque 56mm3; noncalcified plaque 332mm3). TPV is severe   3.  Normal coronary origin with right dominance.   4.  Obstructive CAD 5. Noncalcified plaque in RPDA causes severe (70-99%) stenosis. However, vessel is small (~33mm)   6.  Mild (25-49%) stenosis in mid RCA and proximal ramus  7.  No significant extracardiac incidental findings identified.   CAD-RADS 4 Severe stenosis. (70-99%). Consider symptom-guided anti-ischemic pharmacotherapy as well as risk factor modification per guideline directed care.   Monitor 04/2020:  ZIO XT reviewed. 7 days 2 hours analyzed. Predominant rhythm is sinus with heart rate ranging from 42 bpm up to 126 bpm and average heart rate 74 bpm. There were occasional PACs representing 1.1% total beats, also rare couplets and triplets representing less than 1% total beats. There were rare PVCs including couplets representing less than 1% total beats. Limited ventricular bigeminy noted. Two episodes of NSVT versus aberrantly conducted PSVT were noted, the longest of which lasted for 12 seconds with heart rate in the 130s. There were also episodes of PSVT the longest of which lasted for 19 beats. No pauses noted.  LHC 03/2017:  1. Severe stenosis of the right PDA (distal small vessel location) 2. Mild nonobstructive stenosis of the mid-RCA 3. Minimal irregularity of the LAD and LCx 4. Normal LVEDP   Recommend: medical therapy for chronic exertional angina.  Mild CAD except for the tight lesion in the distal PDA branch which is too small and distal to place a stent.  Echo 10/2016:  Study Conclusions   - Left ventricle: The cavity size was normal. Wall thickness was    normal. Systolic  function was normal. The estimated ejection    fraction was in the range of 60% to 65%. Wall motion was normal;    there were no regional wall motion abnormalities. Left    ventricular diastolic function parameters were normal for the    patient&'s age.  - Aortic valve: Mildly calcified annulus. Trileaflet.  - Mitral valve: There was trivial regurgitation.  - Right atrium: Central venous pressure (est): 3 mm Hg.  - Atrial septum: No defect or patent foramen ovale was identified.  - Tricuspid valve: There was trivial regurgitation.  - Pulmonary arteries: PA peak pressure: 20 mm Hg (S).  - Pericardium, extracardiac: There was no pericardial effusion.      Physical Exam:   VS:  BP 128/80   Pulse 76   Ht 5\' 3"  (1.6 m)   Wt 202 lb 12.8 oz (92 kg)   SpO2 96%   BMI 35.92 kg/m    Wt Readings from Last 3 Encounters:  10/21/22 202 lb 12.8 oz (92 kg)  08/26/22 202 lb (91.6 kg)  07/15/22 204 lb 12.8 oz (92.9 kg)    GEN: Obese, 72 y.o. female in no acute distress NECK: No JVD; No carotid bruits CARDIAC: S1/S2, RRR, no murmurs, rubs, gallops RESPIRATORY:  Clear to auscultation without rales, wheezing or rhonchi  ABDOMEN: Soft, non-tender, non-distended EXTREMITIES:  No edema; No deformity   ASSESSMENT AND PLAN: .    CAD, chest pain Some improvement in her symptoms, does wonder if GERD is contributing mainly to this.  Recent CCTA results noted above. LHC in 2019 revealed severe stenosis of rPDA with mild CAD elsewhere, medical management recommended. Chronic, stable exertional anginal symptoms.  Recommended to follow-up with PCP regarding GERD-see below. Continue current mediation regimen. Heart healthy diet and regular cardiovascular exercise encouraged. ED precautions discussed.  If no improvement in symptoms after being seen by PCP, recommend either increasing Imdur or starting Ranexa.  Care and ED precautions discussed.  HTN BP stable. Continue current medication regimen. Discussed to  monitor BP at home at least 2 hours after medications and sitting for 5-10 minutes. Heart healthy diet and regular cardiovascular exercise encouraged.   HLD LDL 86 08/2022. Continue Crestor 20 mg daily.  Heart healthy diet and regular cardiovascular exercise encouraged.   4. Obesity Weight loss via diet and exercise encouraged. Discussed the impact being overweight would have on cardiovascular risk.  5. GERD Admits to recent symptoms.  Continue pantoprazole daily and she states she takes Pepcid as needed that helps her symptoms.  Recommended to follow-up with PCP for further evaluation.  Dispo: Follow-up with me or APP in 3 months or sooner if anything changes.   Signed, Sharlene Dory, NP

## 2023-01-21 ENCOUNTER — Encounter: Payer: Self-pay | Admitting: Nurse Practitioner

## 2023-01-21 ENCOUNTER — Ambulatory Visit: Payer: Medicare Other | Attending: Nurse Practitioner | Admitting: Nurse Practitioner

## 2023-01-21 VITALS — BP 126/76 | HR 79 | Ht 63.0 in | Wt 205.0 lb

## 2023-01-21 DIAGNOSIS — E669 Obesity, unspecified: Secondary | ICD-10-CM | POA: Diagnosis not present

## 2023-01-21 DIAGNOSIS — R0609 Other forms of dyspnea: Secondary | ICD-10-CM | POA: Insufficient documentation

## 2023-01-21 DIAGNOSIS — R079 Chest pain, unspecified: Secondary | ICD-10-CM | POA: Insufficient documentation

## 2023-01-21 DIAGNOSIS — I25119 Atherosclerotic heart disease of native coronary artery with unspecified angina pectoris: Secondary | ICD-10-CM | POA: Diagnosis not present

## 2023-01-21 DIAGNOSIS — K219 Gastro-esophageal reflux disease without esophagitis: Secondary | ICD-10-CM | POA: Insufficient documentation

## 2023-01-21 DIAGNOSIS — E785 Hyperlipidemia, unspecified: Secondary | ICD-10-CM | POA: Insufficient documentation

## 2023-01-21 DIAGNOSIS — I10 Essential (primary) hypertension: Secondary | ICD-10-CM | POA: Diagnosis not present

## 2023-01-21 MED ORDER — LISINOPRIL 20 MG PO TABS: 20.0000 mg | ORAL_TABLET | Freq: Every day | ORAL | 1 refills | Status: DC

## 2023-01-21 MED ORDER — ISOSORBIDE MONONITRATE ER 60 MG PO TB24: 120.0000 mg | ORAL_TABLET | Freq: Every day | ORAL | 3 refills | Status: DC

## 2023-01-21 NOTE — Patient Instructions (Addendum)
 Medication Instructions:  Your physician has recommended you make the following change in your medication:  Please reduce lisinopril  (ZESTRIL ) to 20 MG tablet daily  Please Increase isosorbide  mononitrate (IMDUR ) 2 tablets daily (120 Mg)    Labwork: None   Testing/Procedures: None   Follow-Up: Your physician recommends that you schedule a follow-up appointment in: 4-6 weeks   Any Other Special Instructions Will Be Listed Below (If Applicable).  If you need a refill on your cardiac medications before your next appointment, please call your pharmacy.

## 2023-01-21 NOTE — Progress Notes (Addendum)
 Cardiology Office Note:  .   Date:  01/21/2023  ID:  Inocente DEL Maselli, DOB 1950/09/14, MRN [id number] PCP: Maree Isles, MD  [facility] Providers Cardiologist:  Jayson Sierras, MD    History of Present Illness: .   Angel Benton is a 73 y.o. female with a PMH of CAD, hypertension, hyperlipidemia, history of palpitations, who presents today for overdue follow-up.  Last seen by Dr. Sierras on March 23, 2021.  She noted stable DOE, denied any chest pain.  Endorsed occasional sense of palpitations.  08/26/2022 - Today she presents for follow-up.  Continues to endorse exertional chest pain, chronic, does notice some improvement after medication adjustment at last visit. Denies any jaw pain or radiation to left arm. Still continues to express shortness of breath, particularly symptoms occur when going up hill during exercise or going upstairs.  Denies any palpitations, syncope, presyncope, dizziness, orthopnea, PND, swelling or significant weight changes, acute bleeding, or claudication. Says she has recovered from contracting Covid-19, believes grief has affected her symptoms. 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 Coping well and states she has a good support system.   Underwent CCTA on 09/02/2022 - see report below.  10/21/2022 -today she presents for follow-up.  Says she feels as though she is doing better since I last saw her.  Still having intermittent chest pain, not as severe as previously.  She has not been as active so it for her difficult to tell if she has been having exertional chest pain.  She feels as though the medication change has helped some.  Attributes her symptoms to GERD, and unsure if this is mainly causing her symptoms. Denies any shortness of breath, palpitations, syncope, presyncope, dizziness, orthopnea, PND, swelling or significant weight changes, acute  bleeding, or claudication.  01/21/2023 -today she presents for follow-up.  She admits to improvement in her reflux symptoms however continues to note chest pain with exertion when going uphill-similar/chronic and stable as compared to last office visit.  She says she is trying to increase her activity and increase her speed on the treadmill, however she does not go up on a incline as she knows this will cause chest pain.  Her dyspnea on exertion is stable and not as severe as her chest pain. Denies any palpitations, syncope, presyncope, dizziness, orthopnea, PND, swelling or significant weight changes, acute bleeding, or claudication.  Studies Reviewed: SABRA    CCTA 08/2022: IMPRESSION: 1. Coronary calcium  score of 334. This was 86th percentile for age and sex matched control.   2. Total plaque volume 323mm3 which is 63rd percentile for age and sex-matched controls (calcified plaque 36mm3; noncalcified plaque 32mm3). TPV is severe   3.  Normal coronary origin with right dominance.   4.  Obstructive CAD 5. Noncalcified plaque in RPDA causes severe (70-99%) stenosis. However, vessel is small (~42mm)   6.  Mild (25-49%) stenosis in mid RCA and proximal ramus  7.  No significant extracardiac incidental findings identified.   CAD-RADS 4 Severe stenosis. (70-99%). Consider symptom-guided anti-ischemic pharmacotherapy as well as risk factor modification per guideline directed care.   Monitor 04/2020:  ZIO XT reviewed. 7 days 2 hours analyzed. Predominant rhythm is sinus with heart rate ranging from 42 bpm up to 126 bpm and average heart rate 74 bpm. There were occasional PACs representing 1.1% total beats, also rare couplets and triplets representing less than 1%  total beats. There were rare PVCs including couplets representing less than 1% total beats. Limited ventricular bigeminy noted. Two episodes of NSVT versus aberrantly conducted PSVT were noted, the longest of which lasted for 12 seconds  with heart rate in the 130s. There were also episodes of PSVT the longest of which lasted for 19 beats. No pauses noted.  LHC 03/2017:  1. Severe stenosis of the right PDA (distal small vessel location) 2. Mild nonobstructive stenosis of the mid-RCA 3. Minimal irregularity of the LAD and LCx 4. Normal LVEDP   Recommend: medical therapy for chronic exertional angina. Mild CAD except for the tight lesion in the distal PDA branch which is too small and distal to place a stent.  Echo 10/2016:  Study Conclusions   - Left ventricle: The cavity size was normal. Wall thickness was    normal. Systolic function was normal. The estimated ejection    fraction was in the range of 60% to 65%. Wall motion was normal;    there were no regional wall motion abnormalities. Left    ventricular diastolic function parameters were normal for the    patient&'s age.  - Aortic valve: Mildly calcified annulus. Trileaflet.  - Mitral valve: There was trivial regurgitation.  - Right atrium: Central venous pressure (est): 3 mm Hg.  - Atrial septum: No defect or patent foramen ovale was identified.  - Tricuspid valve: There was trivial regurgitation.  - Pulmonary arteries: PA peak pressure: 20 mm Hg (S).  - Pericardium, extracardiac: There was no pericardial effusion.      Physical Exam:   VS:  BP 126/76   Pulse 79   Ht 5' 3 (1.6 m)   Wt 205 lb (93 kg)   SpO2 96%   BMI 36.31 kg/m    Wt Readings from Last 3 Encounters:  01/21/23 205 lb (93 kg)  10/21/22 202 lb 12.8 oz (92 kg)  08/26/22 202 lb (91.6 kg)    GEN: Obese, 73 y.o. female in no acute distress NECK: No JVD; No carotid bruits CARDIAC: S1/S2, RRR, no murmurs, rubs, gallops RESPIRATORY:  Clear to auscultation without rales, wheezing or rhonchi  ABDOMEN: Soft, non-tender, non-distended EXTREMITIES:  No edema; No deformity   ASSESSMENT AND PLAN: .    CAD, chest pain, DOE Stable exertional chest pain symptoms as noted from previous office visit,  recent improvement in her GERD symptoms-see below. DOE is not as severe as her CP per her report. CCTA results noted above. LHC in 2019 revealed severe stenosis of rPDA with mild CAD elsewhere, medical management recommended. Chronic, stable exertional anginal symptoms.  Discussed/reviewed treatment options.  Came to shared medical decision that we will decrease lisinopril  to 20 mg daily and increase Imdur  120 mg daily.  Continue aspirin , metoprolol  succinate, rosuvastatin , and nitroglycerin  as needed. Continue current mediation regimen. Heart healthy diet and regular cardiovascular exercise encouraged. If no improvement in symptoms, will consider cardiac catheterization for definitive evaluation of her chest pain and dyspnea on exertion.  Will route note to attending cardiologist for recs.  Care and ED precautions discussed.  HTN BP stable.  Medication changes as noted above.  Discussed to monitor BP at home at least 2 hours after medications and sitting for 5-10 minutes. Heart healthy diet and regular cardiovascular exercise encouraged.   HLD LDL 86 08/2022. Continue Crestor  20 mg daily.  Heart healthy diet and regular cardiovascular exercise encouraged.   4. Obesity Weight loss via diet and exercise encouraged. Discussed the impact being overweight  would have on cardiovascular risk.  5. GERD Admits to recent improvement in symptoms.  Continue current medication regimen.  Recommended to follow-up with PCP.  Dispo: Follow-up with me or APP in 4-6 weeks or sooner if anything changes.   Signed, Almarie Crate, NP

## 2023-02-03 DIAGNOSIS — Z1231 Encounter for screening mammogram for malignant neoplasm of breast: Secondary | ICD-10-CM | POA: Diagnosis not present

## 2023-03-04 ENCOUNTER — Ambulatory Visit: Payer: Medicare Other | Admitting: Nurse Practitioner

## 2023-04-10 ENCOUNTER — Other Ambulatory Visit: Payer: Self-pay | Admitting: Nurse Practitioner

## 2023-04-28 ENCOUNTER — Ambulatory Visit: Payer: Medicare Other | Attending: Nurse Practitioner | Admitting: Nurse Practitioner

## 2023-04-28 ENCOUNTER — Encounter: Payer: Self-pay | Admitting: Nurse Practitioner

## 2023-04-28 VITALS — BP 120/72 | HR 68 | Ht 63.0 in | Wt 204.0 lb

## 2023-04-28 DIAGNOSIS — I2510 Atherosclerotic heart disease of native coronary artery without angina pectoris: Secondary | ICD-10-CM | POA: Diagnosis not present

## 2023-04-28 DIAGNOSIS — E669 Obesity, unspecified: Secondary | ICD-10-CM

## 2023-04-28 DIAGNOSIS — E785 Hyperlipidemia, unspecified: Secondary | ICD-10-CM

## 2023-04-28 DIAGNOSIS — I10 Essential (primary) hypertension: Secondary | ICD-10-CM | POA: Diagnosis not present

## 2023-04-28 DIAGNOSIS — R0609 Other forms of dyspnea: Secondary | ICD-10-CM

## 2023-04-28 NOTE — Progress Notes (Signed)
 Cardiology Office Note:  .   Date:  04/28/2023 ID:  Arnoldo Lapping Krull, DOB July 27, 1950, MRN [id number] PCP: Theoplis Fix, MD  [facility] Providers Cardiologist:  Teddie Favre, MD    History of Present Illness: .   Angel Benton is a 73 y.o. female with a PMH of CAD, hypertension, hyperlipidemia, history of palpitations, who presents today for overdue follow-up.  Last seen by Dr. Londa Rival on March 23, 2021.  She noted stable DOE, denied any chest pain.  Endorsed occasional sense of palpitations.  08/26/2022 - Today she presents for follow-up.  Continues to endorse exertional chest pain, chronic, does notice some improvement after medication adjustment at last visit. Denies any jaw pain or radiation to left arm. Still continues to express shortness of breath, particularly symptoms occur when going up hill during exercise or going upstairs.  Denies any palpitations, syncope, presyncope, dizziness, orthopnea, PND, swelling or significant weight changes, acute bleeding, or claudication. Says she has recovered from contracting Covid-19, believes grief has affected her symptoms. 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 Coping well and states she has a good support system.   Underwent CCTA on 09/02/2022 - see report below.  10/21/2022 -today she presents for follow-up.  Says she feels as though she is doing better since I last saw her.  Still having intermittent chest pain, not as severe as previously.  She has not been as active so it for her difficult to tell if she has been having exertional chest pain.  She feels as though the medication change has helped some.  Attributes her symptoms to GERD, and unsure if this is mainly causing her symptoms. Denies any shortness of breath, palpitations, syncope, presyncope, dizziness, orthopnea, PND, swelling or significant weight changes, acute  bleeding, or claudication.  01/21/2023 -today she presents for follow-up.  She admits to improvement in her reflux symptoms however continues to note chest pain with exertion when going uphill-similar/chronic and stable as compared to last office visit.  She says she is trying to increase her activity and increase her speed on the treadmill, however she does not go up on a incline as she knows this will cause chest pain.  Her dyspnea on exertion is stable and not as severe as her chest pain. Denies any palpitations, syncope, presyncope, dizziness, orthopnea, PND, swelling or significant weight changes, acute bleeding, or claudication.  04/28/2023 -She presents today for follow-up.  Sadly, her sister passed away since last office visit. She is coping well. Does admit to some palpitations, rare in occasion. Does admit to some coughing recently, possibly related to current pollen. Very active and walks around 2.5 miles around 4 days per week, overall feeling fine. Admits to stable DOE with exercise. Denies any chest pain, syncope, presyncope, dizziness, orthopnea, PND, swelling or significant weight changes, acute bleeding, or claudication.  Studies Reviewed: Aaron Aas    EKG: EKG is not ordered today.   CCTA 08/2022: IMPRESSION: 1. Coronary calcium  score of 334. This was 86th percentile for age and sex matched control.   2. Total plaque volume 350mm3 which is 63rd percentile for age and sex-matched controls (calcified plaque 49mm3; noncalcified plaque 370mm3). TPV is severe   3.  Normal coronary origin with right dominance.   4.  Obstructive CAD 5. Noncalcified plaque in RPDA causes severe (70-99%) stenosis. However, vessel is small (~34mm)   6.  Mild (25-49%) stenosis in  mid RCA and proximal ramus  7.  No significant extracardiac incidental findings identified.   CAD-RADS 4 Severe stenosis. (70-99%). Consider symptom-guided anti-ischemic pharmacotherapy as well as risk factor modification per  guideline directed care.   Monitor 04/2020:  ZIO XT reviewed. 7 days 2 hours analyzed. Predominant rhythm is sinus with heart rate ranging from 42 bpm up to 126 bpm and average heart rate 74 bpm. There were occasional PACs representing 1.1% total beats, also rare couplets and triplets representing less than 1% total beats. There were rare PVCs including couplets representing less than 1% total beats. Limited ventricular bigeminy noted. Two episodes of NSVT versus aberrantly conducted PSVT were noted, the longest of which lasted for 12 seconds with heart rate in the 130s. There were also episodes of PSVT the longest of which lasted for 19 beats. No pauses noted.  LHC 03/2017:  1. Severe stenosis of the right PDA (distal small vessel location) 2. Mild nonobstructive stenosis of the mid-RCA 3. Minimal irregularity of the LAD and LCx 4. Normal LVEDP   Recommend: medical therapy for chronic exertional angina. Mild CAD except for the tight lesion in the distal PDA branch which is too small and distal to place a stent.  Echo 10/2016:  Study Conclusions   - Left ventricle: The cavity size was normal. Wall thickness was    normal. Systolic function was normal. The estimated ejection    fraction was in the range of 60% to 65%. Wall motion was normal;    there were no regional wall motion abnormalities. Left    ventricular diastolic function parameters were normal for the    patient&'s age.  - Aortic valve: Mildly calcified annulus. Trileaflet.  - Mitral valve: There was trivial regurgitation.  - Right atrium: Central venous pressure (est): 3 mm Hg.  - Atrial septum: No defect or patent foramen ovale was identified.  - Tricuspid valve: There was trivial regurgitation.  - Pulmonary arteries: PA peak pressure: 20 mm Hg (S).  - Pericardium, extracardiac: There was no pericardial effusion.      Physical Exam:   VS:  BP 120/72   Pulse 68   Ht 5\' 3"  (1.6 m)   Wt 204 lb (92.5 kg)   SpO2 98%   BMI  36.14 kg/m    Wt Readings from Last 3 Encounters:  04/28/23 204 lb (92.5 kg)  01/21/23 205 lb (93 kg)  10/21/22 202 lb 12.8 oz (92 kg)    GEN: Obese, 73 y.o. female in no acute distress NECK: No JVD; No carotid bruits CARDIAC: S1/S2, RRR, no murmurs, rubs, gallops RESPIRATORY:  Clear to auscultation without rales, wheezing or rhonchi  ABDOMEN: Soft, non-tender, non-distended EXTREMITIES:  No edema; No deformity   ASSESSMENT AND PLAN: .    CAD, DOE Denies any chest pain. Admits to stable DOE with exercise. LHC in 2019 revealed severe stenosis of rPDA with mild CAD elsewhere, medical management recommended. No indication for ischemic evaluation. Continue aspirin , metoprolol  succinate, rosuvastatin , and nitroglycerin  as needed. Continue current mediation regimen. Heart healthy diet and regular cardiovascular exercise encouraged.  Care and ED precautions discussed.   HTN BP stable.  Discussed to monitor BP at home at least 2 hours after medications and sitting for 5-10 minutes. Continue current medication regimen. Heart healthy diet and regular cardiovascular exercise encouraged.   HLD LDL 86 08/2022. Continue Crestor  20 mg daily.  Heart healthy diet and regular cardiovascular exercise encouraged.   4. Obesity Weight loss via diet and exercise encouraged.  Discussed the impact being overweight would have on cardiovascular risk.  Dispo: Follow-up with Dr. Londa Rival or APP in 6 months or sooner if anything changes.   Signed, Lasalle Pointer, NP

## 2023-04-28 NOTE — Patient Instructions (Signed)

## 2023-05-02 DIAGNOSIS — Z299 Encounter for prophylactic measures, unspecified: Secondary | ICD-10-CM | POA: Diagnosis not present

## 2023-05-02 DIAGNOSIS — Z1339 Encounter for screening examination for other mental health and behavioral disorders: Secondary | ICD-10-CM | POA: Diagnosis not present

## 2023-05-02 DIAGNOSIS — E7800 Pure hypercholesterolemia, unspecified: Secondary | ICD-10-CM | POA: Diagnosis not present

## 2023-05-02 DIAGNOSIS — Z0000 Encounter for general adult medical examination without abnormal findings: Secondary | ICD-10-CM | POA: Diagnosis not present

## 2023-05-02 DIAGNOSIS — Z79899 Other long term (current) drug therapy: Secondary | ICD-10-CM | POA: Diagnosis not present

## 2023-05-02 DIAGNOSIS — R5383 Other fatigue: Secondary | ICD-10-CM | POA: Diagnosis not present

## 2023-05-02 DIAGNOSIS — Z7189 Other specified counseling: Secondary | ICD-10-CM | POA: Diagnosis not present

## 2023-05-02 DIAGNOSIS — Z1331 Encounter for screening for depression: Secondary | ICD-10-CM | POA: Diagnosis not present

## 2023-05-02 DIAGNOSIS — Z6834 Body mass index (BMI) 34.0-34.9, adult: Secondary | ICD-10-CM | POA: Diagnosis not present

## 2023-05-02 DIAGNOSIS — Z87891 Personal history of nicotine dependence: Secondary | ICD-10-CM | POA: Diagnosis not present

## 2023-05-02 DIAGNOSIS — I10 Essential (primary) hypertension: Secondary | ICD-10-CM | POA: Diagnosis not present

## 2023-05-10 DIAGNOSIS — B029 Zoster without complications: Secondary | ICD-10-CM | POA: Diagnosis not present

## 2023-08-01 ENCOUNTER — Other Ambulatory Visit: Payer: Self-pay | Admitting: Nurse Practitioner

## 2023-08-10 DIAGNOSIS — R350 Frequency of micturition: Secondary | ICD-10-CM | POA: Diagnosis not present

## 2023-08-10 DIAGNOSIS — N390 Urinary tract infection, site not specified: Secondary | ICD-10-CM | POA: Diagnosis not present

## 2023-08-30 ENCOUNTER — Other Ambulatory Visit: Payer: Self-pay | Admitting: Nurse Practitioner

## 2023-09-05 ENCOUNTER — Other Ambulatory Visit: Payer: Self-pay | Admitting: Nurse Practitioner

## 2023-09-11 ENCOUNTER — Telehealth: Payer: Self-pay | Admitting: Cardiology

## 2023-09-11 MED ORDER — ISOSORBIDE MONONITRATE ER 60 MG PO TB24
120.0000 mg | ORAL_TABLET | Freq: Every day | ORAL | 2 refills | Status: AC
  Filled 2023-11-03: qty 180, 90d supply, fill #0

## 2023-09-11 NOTE — Telephone Encounter (Signed)
 Pt's medication was sent to pt's pharmacy as requested. Confirmation received.

## 2023-09-11 NOTE — Telephone Encounter (Signed)
 1. Which medications need to be refilled? (please list name of each medication and dose if known)   Isosorbide  60 mg 2 tablets daily  2. Which pharmacy/location (including street and city if local pharmacy) is medication to be sent to?  [facility] [place]  3. Do they need a 30 day or 90 day supply? 90

## 2023-09-19 DIAGNOSIS — H43811 Vitreous degeneration, right eye: Secondary | ICD-10-CM | POA: Diagnosis not present

## 2023-09-26 DIAGNOSIS — M25512 Pain in left shoulder: Secondary | ICD-10-CM | POA: Diagnosis not present

## 2023-09-28 ENCOUNTER — Encounter: Payer: Self-pay | Admitting: Cardiology

## 2023-10-04 DIAGNOSIS — Z23 Encounter for immunization: Secondary | ICD-10-CM | POA: Diagnosis not present

## 2023-10-25 ENCOUNTER — Other Ambulatory Visit: Payer: Self-pay | Admitting: Nurse Practitioner

## 2023-11-03 ENCOUNTER — Other Ambulatory Visit (HOSPITAL_BASED_OUTPATIENT_CLINIC_OR_DEPARTMENT_OTHER): Payer: Self-pay

## 2023-11-03 ENCOUNTER — Encounter: Payer: Self-pay | Admitting: Cardiology

## 2023-11-03 ENCOUNTER — Ambulatory Visit: Attending: Cardiology | Admitting: Cardiology

## 2023-11-03 ENCOUNTER — Other Ambulatory Visit: Payer: Self-pay | Admitting: Nurse Practitioner

## 2023-11-03 VITALS — BP 132/80 | HR 73 | Ht 62.0 in | Wt 210.0 lb

## 2023-11-03 DIAGNOSIS — I25118 Atherosclerotic heart disease of native coronary artery with other forms of angina pectoris: Secondary | ICD-10-CM | POA: Insufficient documentation

## 2023-11-03 DIAGNOSIS — E782 Mixed hyperlipidemia: Secondary | ICD-10-CM | POA: Diagnosis not present

## 2023-11-03 DIAGNOSIS — I10 Essential (primary) hypertension: Secondary | ICD-10-CM | POA: Insufficient documentation

## 2023-11-03 DIAGNOSIS — Z23 Encounter for immunization: Secondary | ICD-10-CM | POA: Diagnosis not present

## 2023-11-03 MED ORDER — NITROGLYCERIN 0.4 MG SL SUBL
0.4000 mg | SUBLINGUAL_TABLET | SUBLINGUAL | 3 refills | Status: AC | PRN
  Filled 2023-11-03: qty 25, 7d supply, fill #0

## 2023-11-03 MED ORDER — PANTOPRAZOLE SODIUM 40 MG PO TBEC
40.0000 mg | DELAYED_RELEASE_TABLET | ORAL | 0 refills | Status: AC
  Filled 2023-11-03 – 2024-01-06 (×3): qty 90, 90d supply, fill #0

## 2023-11-03 MED ORDER — COMIRNATY 30 MCG/0.3ML IM SUSY
0.3000 mL | PREFILLED_SYRINGE | Freq: Once | INTRAMUSCULAR | 0 refills | Status: AC
Start: 2023-11-03 — End: 2023-11-04
  Filled 2023-11-03: qty 0.3, 1d supply, fill #0

## 2023-11-03 MED ORDER — LISINOPRIL 20 MG PO TABS
20.0000 mg | ORAL_TABLET | Freq: Every day | ORAL | 3 refills | Status: AC
  Filled 2023-11-03: qty 90, 90d supply, fill #0
  Filled 2024-01-28: qty 90, 90d supply, fill #1

## 2023-11-03 MED ORDER — ESCITALOPRAM OXALATE 5 MG PO TABS
5.0000 mg | ORAL_TABLET | Freq: Every day | ORAL | 2 refills | Status: AC
  Filled 2023-11-03: qty 90, 90d supply, fill #0

## 2023-11-03 NOTE — Patient Instructions (Addendum)

## 2023-11-03 NOTE — Progress Notes (Signed)
    Cardiology Office Note  Date: 11/03/2023   ID: EVYN KOOYMAN, DOB 24-Mar-1950, MRN [id number]  History of Present Illness: Angel Benton is a 73 y.o. female last seen in April by Ms. Miriam NP, I reviewed her note.  Our last visit was in March 2023.  She is here for a routine visit.  Reports no major change in status.  Typically enjoys walking for exercise, does have some angina with hills, no progressive symptoms however.  She has not had to use any nitroglycerin .  I reviewed her medications which are stable from a cardiac perspective.  She reports compliance with therapy.  We discussed getting a refill for fresh bottle of nitroglycerin .  Lipid panel in April showed LDL 67.  I reviewed her ECG today which shows normal sinus rhythm.  Physical Exam: VS:  BP 132/80 (BP Location: Left Arm, Cuff Size: Large)   Pulse 73   Ht 5' 2 (1.575 m)   Wt 210 lb (95.3 kg)   SpO2 95%   BMI 38.41 kg/m , BMI Body mass index is 38.41 kg/m.  Wt Readings from Last 3 Encounters:  11/03/23 210 lb (95.3 kg)  04/28/23 204 lb (92.5 kg)  01/21/23 205 lb (93 kg)    General: Patient appears comfortable at rest. HEENT: Conjunctiva and lids normal. Neck: Supple, no elevated JVP or carotid bruits. Lungs: Clear to auscultation, nonlabored breathing at rest. Cardiac: Regular rate and rhythm, no S3 or significant systolic murmur. Extremities: No pitting edema.  ECG:  An ECG dated 11/03/2023 was personally reviewed today and demonstrated:  Sinus rhythm.  Labwork:  April 2025: Hemoglobin 14, platelets 322, BUN 13, creatinine 0.82, potassium 4.4, AST 20, ALT 16, cholesterol 147, triglycerides 94, HDL 63, LDL 67, TSH 1.67  Other Studies Reviewed Today:  No interval cardiac testing for review today.  Assessment and Plan:  1.  Medically managed CAD with significant stenosis involving small segment of distal PDA with otherwise mild RCA disease as of 2019.  Follow-up coronary CTA in August 2024 showed  calcium  score 334 with total plaque volume 378 (63rd percentile) and no obstructive left coronary system disease, stable PDA disease.  Has intermittent exertional angina, no progressive pattern.  ECG is normal today.  Plan to continue medical therapy including aspirin  81 mg daily, Imdur  120 mg daily, Crestor  20 mg daily, and as needed nitroglycerin  which will be refilled.   2.  Mixed hyperlipidemia.  LDL 67 in April.  Continue Crestor  20 mg daily.   3.  History of palpitations with cardiac monitor in 2022 showing occasional PACs and rare PVCs with brief episodes of PSVT, some of which are aberrantly conducted.  She has Toprol -XL 12.5 mg to take as needed.  No increasing symptoms or syncope.   4.  Primary hypertension.  Continue lisinopril  20 mg daily, chlorthalidone  12.5 mg daily, and Norvasc  10 mg daily.  Disposition:  Follow up 1 year.  Signed, Jayson JUDITHANN Sierras, M.D., F.A.C.C. [facility]

## 2023-11-04 ENCOUNTER — Other Ambulatory Visit: Payer: Self-pay

## 2023-11-04 MED ORDER — CHLORTHALIDONE 25 MG PO TABS
12.5000 mg | ORAL_TABLET | ORAL | 3 refills | Status: DC
  Filled 2023-11-04: qty 45, 180d supply, fill #0

## 2023-11-04 MED ORDER — CHLORTHALIDONE 25 MG PO TABS
12.5000 mg | ORAL_TABLET | ORAL | 3 refills | Status: AC
  Filled 2023-11-04: qty 22, 88d supply, fill #0

## 2023-11-04 MED ORDER — ROSUVASTATIN CALCIUM 20 MG PO TABS
20.0000 mg | ORAL_TABLET | Freq: Every day | ORAL | 2 refills | Status: DC
  Filled 2023-11-04: qty 90, 90d supply, fill #0

## 2023-11-04 MED ORDER — ROSUVASTATIN CALCIUM 20 MG PO TABS
20.0000 mg | ORAL_TABLET | Freq: Every day | ORAL | 2 refills | Status: AC
  Filled 2023-11-04: qty 90, 90d supply, fill #0
  Filled 2024-01-28: qty 90, 90d supply, fill #1

## 2023-11-05 ENCOUNTER — Other Ambulatory Visit (HOSPITAL_BASED_OUTPATIENT_CLINIC_OR_DEPARTMENT_OTHER): Payer: Self-pay

## 2023-11-26 DIAGNOSIS — M1711 Unilateral primary osteoarthritis, right knee: Secondary | ICD-10-CM | POA: Diagnosis not present

## 2023-12-09 ENCOUNTER — Other Ambulatory Visit (HOSPITAL_COMMUNITY): Payer: Self-pay | Admitting: Orthopedic Surgery

## 2023-12-09 DIAGNOSIS — M25561 Pain in right knee: Secondary | ICD-10-CM

## 2023-12-18 ENCOUNTER — Ambulatory Visit (HOSPITAL_COMMUNITY): Admission: RE | Admit: 2023-12-18 | Discharge: 2023-12-18 | Attending: Orthopedic Surgery

## 2023-12-18 DIAGNOSIS — S83231A Complex tear of medial meniscus, current injury, right knee, initial encounter: Secondary | ICD-10-CM | POA: Diagnosis not present

## 2023-12-18 DIAGNOSIS — M25561 Pain in right knee: Secondary | ICD-10-CM | POA: Insufficient documentation

## 2023-12-18 DIAGNOSIS — M7121 Synovial cyst of popliteal space [Baker], right knee: Secondary | ICD-10-CM | POA: Diagnosis not present

## 2023-12-24 DIAGNOSIS — M25561 Pain in right knee: Secondary | ICD-10-CM | POA: Diagnosis not present

## 2023-12-24 DIAGNOSIS — S83231D Complex tear of medial meniscus, current injury, right knee, subsequent encounter: Secondary | ICD-10-CM | POA: Diagnosis not present

## 2023-12-24 DIAGNOSIS — M1711 Unilateral primary osteoarthritis, right knee: Secondary | ICD-10-CM | POA: Diagnosis not present

## 2023-12-25 ENCOUNTER — Telehealth (HOSPITAL_BASED_OUTPATIENT_CLINIC_OR_DEPARTMENT_OTHER): Payer: Self-pay | Admitting: *Deleted

## 2023-12-25 NOTE — Telephone Encounter (Signed)
° °  Pre-operative Risk Assessment    Patient Name: Angel Benton  DOB: [birth year] MRN: [id number]   Date of last office visit: 11/03/23 DR. MCDOWELL Date of next office visit: NONE   Request for Surgical Clearance    Procedure:  RIGHT TOTAL KNEE REPLACEMENT  Date of Surgery:  Clearance TBD                                Surgeon:  DR. EVALENE CHANCY Surgeon's Group or Practice Name:  [facility] Phone number:  [phone] [facility] Fax number:  [phone]   Type of Clearance Requested:   - Medical  - Pharmacy:  Hold Aspirin      Type of Anesthesia:  Spinal   Additional requests/questions:    Bonney Niels Jest   12/25/2023, 11:10 AM

## 2023-12-25 NOTE — Telephone Encounter (Signed)
° °  Name: Angel Benton  DOB: 05/01/1950  MRN: [id number]   Primary Cardiologist: Jayson Sierras, MD  Chart reviewed as part of pre-operative protocol coverage. Angel Benton was last seen on 11/03/2023 by Dr. Sierras.  Per Dr. Sierras Chart reviewed. Patient clinically stable from a cardiac perspective as of October's visit. She has medically managed CAD. Plan for knee replacement with spinal anesthesia, relatively low risk from a cardiac perspective. She should be able to proceed without further ischemic testing presuming her cardiac status has not changed in the interim. May hold aspirin  7 days prior.  Therefore, based on ACC/AHA guidelines, the patient would be an acceptable risk for the planned procedure without further cardiovascular testing.   I will route this recommendation to the requesting party via Epic fax function and remove from pre-op pool. Please call with questions.  Barnie Hila, NP 12/25/2023, 4:26 PM

## 2023-12-25 NOTE — Telephone Encounter (Signed)
 Dr. Debera,  You saw this patient on 11/03/2023. Per office protocol, will you please comment on medical clearance for right total knee replacement not yet scheduled?  Please route your response to [facility] Preop. I will communicate with requesting office once you have given recommendations.   Thank you!  Barnie Hila, NP

## 2024-01-05 NOTE — Telephone Encounter (Signed)
 Requesting office sent duplicate. I have reviewed the chart and the pt was cleared by Barnie Hila, NP on 12/25/23. I will re-fax the clearance notes to surgeon office.

## 2024-01-06 ENCOUNTER — Other Ambulatory Visit (HOSPITAL_BASED_OUTPATIENT_CLINIC_OR_DEPARTMENT_OTHER): Payer: Self-pay

## 2024-01-29 ENCOUNTER — Other Ambulatory Visit (HOSPITAL_BASED_OUTPATIENT_CLINIC_OR_DEPARTMENT_OTHER): Payer: Self-pay

## 2024-02-05 ENCOUNTER — Other Ambulatory Visit (HOSPITAL_BASED_OUTPATIENT_CLINIC_OR_DEPARTMENT_OTHER): Payer: Self-pay

## 2024-02-05 MED ORDER — MELOXICAM 15 MG PO TABS
15.0000 mg | ORAL_TABLET | Freq: Every day | ORAL | 2 refills | Status: AC
  Filled 2024-02-05: qty 30, 30d supply, fill #0

## 2024-02-05 MED ORDER — ASPIRIN 81 MG PO CHEW
81.0000 mg | CHEWABLE_TABLET | Freq: Two times a day (BID) | ORAL | 0 refills | Status: AC
  Filled 2024-02-05: qty 28, 14d supply, fill #0

## 2024-02-05 MED ORDER — ONDANSETRON 4 MG PO TBDP
4.0000 mg | ORAL_TABLET | Freq: Three times a day (TID) | ORAL | 0 refills | Status: AC | PRN
  Filled 2024-02-05: qty 9, 3d supply, fill #0

## 2024-02-05 MED ORDER — HYDROCODONE-ACETAMINOPHEN 10-325 MG PO TABS
1.0000 | ORAL_TABLET | Freq: Four times a day (QID) | ORAL | 0 refills | Status: AC | PRN
  Filled 2024-02-05: qty 28, 7d supply, fill #0
